# Patient Record
Sex: Female | Born: 1950 | Race: White | Hispanic: No | Smoking: Current some day smoker
Health system: Southern US, Community
[De-identification: ages and names within clinical notes are randomized; demographics above are authoritative.]

## PROBLEM LIST (undated history)

## (undated) DIAGNOSIS — G709 Myoneural disorder, unspecified: Secondary | ICD-10-CM

## (undated) DIAGNOSIS — R928 Other abnormal and inconclusive findings on diagnostic imaging of breast: Secondary | ICD-10-CM

## (undated) DIAGNOSIS — F329 Major depressive disorder, single episode, unspecified: Secondary | ICD-10-CM

## (undated) DIAGNOSIS — M8580 Other specified disorders of bone density and structure, unspecified site: Secondary | ICD-10-CM

## (undated) DIAGNOSIS — G35 Multiple sclerosis: Secondary | ICD-10-CM

## (undated) DIAGNOSIS — E785 Hyperlipidemia, unspecified: Secondary | ICD-10-CM

## (undated) DIAGNOSIS — F32A Depression, unspecified: Secondary | ICD-10-CM

## (undated) DIAGNOSIS — M65841 Other synovitis and tenosynovitis, right hand: Secondary | ICD-10-CM

## (undated) HISTORY — PX: TUBAL LIGATION: SHX77

## (undated) HISTORY — DX: Depression, unspecified: F32.A

## (undated) HISTORY — DX: Other specified disorders of bone density and structure, unspecified site: M85.80

## (undated) HISTORY — DX: Multiple sclerosis: G35

## (undated) HISTORY — DX: Hyperlipidemia, unspecified: E78.5

## (undated) HISTORY — DX: Major depressive disorder, single episode, unspecified: F32.9

---

## 1999-08-05 ENCOUNTER — Encounter: Admission: RE | Admit: 1999-08-05 | Discharge: 1999-09-20 | Payer: Self-pay | Admitting: Sports Medicine

## 1999-08-15 ENCOUNTER — Emergency Department (HOSPITAL_COMMUNITY): Admission: EM | Admit: 1999-08-15 | Discharge: 1999-08-15 | Payer: Self-pay | Admitting: Emergency Medicine

## 2000-06-03 ENCOUNTER — Other Ambulatory Visit: Admission: RE | Admit: 2000-06-03 | Discharge: 2000-06-03 | Payer: Self-pay | Admitting: Family Medicine

## 2003-12-22 ENCOUNTER — Other Ambulatory Visit: Admission: RE | Admit: 2003-12-22 | Discharge: 2003-12-22 | Payer: Self-pay | Admitting: Family Medicine

## 2004-01-04 ENCOUNTER — Encounter: Admission: RE | Admit: 2004-01-04 | Discharge: 2004-01-04 | Payer: Self-pay | Admitting: Family Medicine

## 2005-09-26 ENCOUNTER — Ambulatory Visit: Payer: Self-pay | Admitting: Family Medicine

## 2005-10-01 ENCOUNTER — Other Ambulatory Visit: Admission: RE | Admit: 2005-10-01 | Discharge: 2005-10-01 | Payer: Self-pay | Admitting: Family Medicine

## 2005-10-01 ENCOUNTER — Ambulatory Visit: Payer: Self-pay | Admitting: Family Medicine

## 2005-10-08 ENCOUNTER — Encounter: Admission: RE | Admit: 2005-10-08 | Discharge: 2005-10-08 | Payer: Self-pay | Admitting: Family Medicine

## 2005-10-21 ENCOUNTER — Encounter: Admission: RE | Admit: 2005-10-21 | Discharge: 2005-10-21 | Payer: Self-pay | Admitting: Family Medicine

## 2009-07-11 ENCOUNTER — Other Ambulatory Visit: Admission: RE | Admit: 2009-07-11 | Discharge: 2009-07-11 | Payer: Self-pay | Admitting: Family Medicine

## 2009-08-09 ENCOUNTER — Encounter: Admission: RE | Admit: 2009-08-09 | Discharge: 2009-08-09 | Payer: Self-pay | Admitting: Family Medicine

## 2010-02-13 ENCOUNTER — Other Ambulatory Visit: Admission: RE | Admit: 2010-02-13 | Discharge: 2010-02-13 | Payer: Self-pay | Admitting: Family Medicine

## 2010-05-12 ENCOUNTER — Encounter: Payer: Self-pay | Admitting: Family Medicine

## 2010-12-27 ENCOUNTER — Other Ambulatory Visit: Payer: Self-pay | Admitting: Physician Assistant

## 2010-12-27 ENCOUNTER — Other Ambulatory Visit (HOSPITAL_COMMUNITY)
Admission: RE | Admit: 2010-12-27 | Discharge: 2010-12-27 | Disposition: A | Discharge status: Home / Self Care | Payer: 59 | Source: Ambulatory Visit | Attending: Family Medicine | Admitting: Family Medicine

## 2010-12-27 DIAGNOSIS — Z01419 Encounter for gynecological examination (general) (routine) without abnormal findings: Secondary | ICD-10-CM | POA: Insufficient documentation

## 2012-01-07 ENCOUNTER — Other Ambulatory Visit (HOSPITAL_COMMUNITY)
Admission: RE | Admit: 2012-01-07 | Discharge: 2012-01-07 | Disposition: A | Discharge status: Home / Self Care | Payer: 59 | Source: Ambulatory Visit | Attending: Family Medicine | Admitting: Family Medicine

## 2012-01-07 ENCOUNTER — Other Ambulatory Visit: Payer: Self-pay | Admitting: Physician Assistant

## 2012-01-07 DIAGNOSIS — Z124 Encounter for screening for malignant neoplasm of cervix: Secondary | ICD-10-CM | POA: Insufficient documentation

## 2012-01-13 ENCOUNTER — Other Ambulatory Visit: Payer: Self-pay | Admitting: Family Medicine

## 2012-01-13 DIAGNOSIS — Z1231 Encounter for screening mammogram for malignant neoplasm of breast: Secondary | ICD-10-CM

## 2012-02-02 ENCOUNTER — Ambulatory Visit
Admission: RE | Admit: 2012-02-02 | Discharge: 2012-02-02 | Disposition: A | Discharge status: Home / Self Care | Payer: 59 | Source: Ambulatory Visit | Attending: Family Medicine | Admitting: Family Medicine

## 2012-02-02 DIAGNOSIS — Z1231 Encounter for screening mammogram for malignant neoplasm of breast: Secondary | ICD-10-CM

## 2012-02-04 ENCOUNTER — Other Ambulatory Visit: Payer: Self-pay | Admitting: Family Medicine

## 2012-02-04 DIAGNOSIS — R928 Other abnormal and inconclusive findings on diagnostic imaging of breast: Secondary | ICD-10-CM

## 2012-02-10 ENCOUNTER — Ambulatory Visit
Admission: RE | Admit: 2012-02-10 | Discharge: 2012-02-10 | Disposition: A | Discharge status: Home / Self Care | Payer: 59 | Source: Ambulatory Visit | Attending: Family Medicine | Admitting: Family Medicine

## 2012-02-10 DIAGNOSIS — R928 Other abnormal and inconclusive findings on diagnostic imaging of breast: Secondary | ICD-10-CM

## 2013-01-20 ENCOUNTER — Other Ambulatory Visit: Payer: Self-pay | Admitting: Physician Assistant

## 2013-01-20 DIAGNOSIS — N951 Menopausal and female climacteric states: Secondary | ICD-10-CM

## 2013-01-20 DIAGNOSIS — Z1231 Encounter for screening mammogram for malignant neoplasm of breast: Secondary | ICD-10-CM

## 2013-02-04 ENCOUNTER — Ambulatory Visit
Admission: RE | Admit: 2013-02-04 | Discharge: 2013-02-04 | Disposition: A | Discharge status: Home / Self Care | Payer: 59 | Source: Ambulatory Visit | Attending: Physician Assistant | Admitting: Physician Assistant

## 2013-02-04 DIAGNOSIS — N951 Menopausal and female climacteric states: Secondary | ICD-10-CM

## 2013-02-04 DIAGNOSIS — Z1231 Encounter for screening mammogram for malignant neoplasm of breast: Secondary | ICD-10-CM

## 2014-01-30 ENCOUNTER — Encounter: Payer: Self-pay | Admitting: *Deleted

## 2014-02-06 ENCOUNTER — Other Ambulatory Visit: Payer: Self-pay | Admitting: Physician Assistant

## 2014-02-06 ENCOUNTER — Other Ambulatory Visit (HOSPITAL_COMMUNITY)
Admission: RE | Admit: 2014-02-06 | Discharge: 2014-02-06 | Disposition: A | Discharge status: Home / Self Care | Payer: 59 | Source: Ambulatory Visit | Attending: Family Medicine | Admitting: Family Medicine

## 2014-02-06 DIAGNOSIS — Z124 Encounter for screening for malignant neoplasm of cervix: Secondary | ICD-10-CM | POA: Insufficient documentation

## 2014-02-08 LAB — CYTOLOGY - PAP

## 2014-07-27 ENCOUNTER — Other Ambulatory Visit: Payer: Self-pay

## 2014-07-27 DIAGNOSIS — Z1231 Encounter for screening mammogram for malignant neoplasm of breast: Secondary | ICD-10-CM

## 2014-08-03 ENCOUNTER — Ambulatory Visit
Admission: RE | Admit: 2014-08-03 | Discharge: 2014-08-03 | Disposition: A | Discharge status: Home / Self Care | Payer: 59 | Source: Ambulatory Visit

## 2014-08-03 DIAGNOSIS — Z1231 Encounter for screening mammogram for malignant neoplasm of breast: Secondary | ICD-10-CM

## 2014-10-19 ENCOUNTER — Other Ambulatory Visit: Payer: Self-pay | Admitting: Orthopedic Surgery

## 2014-11-20 ENCOUNTER — Encounter (HOSPITAL_BASED_OUTPATIENT_CLINIC_OR_DEPARTMENT_OTHER): Payer: Self-pay | Admitting: *Deleted

## 2014-11-23 ENCOUNTER — Ambulatory Visit (HOSPITAL_BASED_OUTPATIENT_CLINIC_OR_DEPARTMENT_OTHER): Payer: 59 | Admitting: Anesthesiology

## 2014-11-23 ENCOUNTER — Encounter (HOSPITAL_BASED_OUTPATIENT_CLINIC_OR_DEPARTMENT_OTHER)
Admission: RE | Disposition: A | Discharge status: Home / Self Care | Payer: Self-pay | Source: Ambulatory Visit | Attending: Orthopedic Surgery

## 2014-11-23 ENCOUNTER — Ambulatory Visit (HOSPITAL_BASED_OUTPATIENT_CLINIC_OR_DEPARTMENT_OTHER)
Admission: RE | Admit: 2014-11-23 | Discharge: 2014-11-23 | Disposition: A | Discharge status: Home / Self Care | Payer: 59 | Source: Ambulatory Visit | Attending: Orthopedic Surgery | Admitting: Orthopedic Surgery

## 2014-11-23 ENCOUNTER — Encounter (HOSPITAL_BASED_OUTPATIENT_CLINIC_OR_DEPARTMENT_OTHER): Payer: Self-pay | Admitting: Anesthesiology

## 2014-11-23 DIAGNOSIS — E785 Hyperlipidemia, unspecified: Secondary | ICD-10-CM | POA: Insufficient documentation

## 2014-11-23 DIAGNOSIS — G35 Multiple sclerosis: Secondary | ICD-10-CM | POA: Insufficient documentation

## 2014-11-23 DIAGNOSIS — F17210 Nicotine dependence, cigarettes, uncomplicated: Secondary | ICD-10-CM | POA: Diagnosis not present

## 2014-11-23 DIAGNOSIS — M6588 Other synovitis and tenosynovitis, other site: Secondary | ICD-10-CM | POA: Insufficient documentation

## 2014-11-23 HISTORY — DX: Other synovitis and tenosynovitis, right hand: M65.841

## 2014-11-23 HISTORY — PX: TRIGGER FINGER RELEASE: SHX641

## 2014-11-23 LAB — POCT HEMOGLOBIN-HEMACUE: Hemoglobin: 14 g/dL (ref 12.0–15.0)

## 2014-11-23 SURGERY — RELEASE, A1 PULLEY, FOR TRIGGER FINGER
Anesthesia: General | Site: Finger | Laterality: Right

## 2014-11-23 MED ORDER — LACTATED RINGERS IV SOLN
INTRAVENOUS | Status: DC
  Administered 2014-11-23: 11:00:00 via INTRAVENOUS

## 2014-11-23 MED ORDER — MIDAZOLAM HCL 2 MG/2ML IJ SOLN
INTRAMUSCULAR | Status: AC
  Filled 2014-11-23: qty 2

## 2014-11-23 MED ORDER — PHENYLEPHRINE HCL 10 MG/ML IJ SOLN
INTRAMUSCULAR | Status: DC | PRN
  Administered 2014-11-23: 40 ug via INTRAVENOUS

## 2014-11-23 MED ORDER — FENTANYL CITRATE (PF) 100 MCG/2ML IJ SOLN
INTRAMUSCULAR | Status: AC
  Filled 2014-11-23: qty 6

## 2014-11-23 MED ORDER — GLYCOPYRROLATE 0.2 MG/ML IJ SOLN
INTRAMUSCULAR | Status: DC | PRN
  Administered 2014-11-23: 0.2 mg via INTRAVENOUS

## 2014-11-23 MED ORDER — FENTANYL CITRATE (PF) 100 MCG/2ML IJ SOLN: 50.0000 ug | INTRAMUSCULAR | Status: DC | PRN

## 2014-11-23 MED ORDER — MEPERIDINE HCL 25 MG/ML IJ SOLN: 6.2500 mg | INTRAMUSCULAR | Status: DC | PRN

## 2014-11-23 MED ORDER — BUPIVACAINE HCL (PF) 0.25 % IJ SOLN
INTRAMUSCULAR | Status: DC | PRN
  Administered 2014-11-23: 2 mL

## 2014-11-23 MED ORDER — MIDAZOLAM HCL 2 MG/2ML IJ SOLN: 1.0000 mg | INTRAMUSCULAR | Status: DC | PRN

## 2014-11-23 MED ORDER — SCOPOLAMINE 1 MG/3DAYS TD PT72: 1.0000 | MEDICATED_PATCH | Freq: Once | TRANSDERMAL | Status: DC | PRN

## 2014-11-23 MED ORDER — HYDROCODONE-ACETAMINOPHEN 5-325 MG PO TABS: 1.0000 | ORAL_TABLET | Freq: Four times a day (QID) | ORAL | Status: DC | PRN

## 2014-11-23 MED ORDER — HYDROMORPHONE HCL 1 MG/ML IJ SOLN: 0.2500 mg | INTRAMUSCULAR | Status: DC | PRN

## 2014-11-23 MED ORDER — CEFAZOLIN SODIUM-DEXTROSE 2-3 GM-% IV SOLR
INTRAVENOUS | Status: AC
  Filled 2014-11-23: qty 50

## 2014-11-23 MED ORDER — CEFAZOLIN SODIUM-DEXTROSE 2-3 GM-% IV SOLR
2.0000 g | INTRAVENOUS | Status: AC
  Administered 2014-11-23: 2 g via INTRAVENOUS

## 2014-11-23 MED ORDER — CEFAZOLIN SODIUM-DEXTROSE 2-3 GM-% IV SOLR: 2.0000 g | INTRAVENOUS | Status: DC

## 2014-11-23 MED ORDER — EPHEDRINE SULFATE 50 MG/ML IJ SOLN
INTRAMUSCULAR | Status: DC | PRN
  Administered 2014-11-23 (×2): 10 mg via INTRAVENOUS

## 2014-11-23 MED ORDER — CHLORHEXIDINE GLUCONATE 4 % EX LIQD: 60.0000 mL | Freq: Once | CUTANEOUS | Status: DC

## 2014-11-23 MED ORDER — FENTANYL CITRATE (PF) 100 MCG/2ML IJ SOLN
INTRAMUSCULAR | Status: DC | PRN
  Administered 2014-11-23: 100 ug via INTRAVENOUS

## 2014-11-23 MED ORDER — MIDAZOLAM HCL 5 MG/5ML IJ SOLN
INTRAMUSCULAR | Status: DC | PRN
  Administered 2014-11-23: 2 mg via INTRAVENOUS

## 2014-11-23 MED ORDER — OXYCODONE HCL 5 MG/5ML PO SOLN: 5.0000 mg | Freq: Once | ORAL | Status: DC | PRN

## 2014-11-23 MED ORDER — ONDANSETRON HCL 4 MG/2ML IJ SOLN
INTRAMUSCULAR | Status: DC | PRN
  Administered 2014-11-23: 4 mg via INTRAVENOUS

## 2014-11-23 MED ORDER — OXYCODONE HCL 5 MG PO TABS: 5.0000 mg | ORAL_TABLET | Freq: Once | ORAL | Status: DC | PRN

## 2014-11-23 MED ORDER — GLYCOPYRROLATE 0.2 MG/ML IJ SOLN: 0.2000 mg | Freq: Once | INTRAMUSCULAR | Status: DC | PRN

## 2014-11-23 SURGICAL SUPPLY — 29 items
BANDAGE COBAN STERILE 2 (GAUZE/BANDAGES/DRESSINGS) ×2 IMPLANT
BLADE SURG 15 STRL LF DISP TIS (BLADE) ×1 IMPLANT
BLADE SURG 15 STRL SS (BLADE) ×1
BNDG ESMARK 4X9 LF (GAUZE/BANDAGES/DRESSINGS) ×2 IMPLANT
CHLORAPREP W/TINT 26ML (MISCELLANEOUS) ×2 IMPLANT
CORDS BIPOLAR (ELECTRODE) ×2 IMPLANT
COVER BACK TABLE 60X90IN (DRAPES) ×2 IMPLANT
COVER MAYO STAND STRL (DRAPES) ×2 IMPLANT
CUFF TOURNIQUET SINGLE 18IN (TOURNIQUET CUFF) ×2 IMPLANT
DECANTER SPIKE VIAL GLASS SM (MISCELLANEOUS) IMPLANT
DRAPE EXTREMITY T 121X128X90 (DRAPE) ×2 IMPLANT
DRAPE SURG 17X23 STRL (DRAPES) ×2 IMPLANT
GAUZE SPONGE 4X4 12PLY STRL (GAUZE/BANDAGES/DRESSINGS) ×2 IMPLANT
GAUZE XEROFORM 1X8 LF (GAUZE/BANDAGES/DRESSINGS) ×2 IMPLANT
GLOVE BIOGEL PI IND STRL 8.5 (GLOVE) ×1 IMPLANT
GLOVE BIOGEL PI INDICATOR 8.5 (GLOVE) ×1
GLOVE SURG ORTHO 8.0 STRL STRW (GLOVE) ×2 IMPLANT
GOWN STRL REUS W/ TWL LRG LVL3 (GOWN DISPOSABLE) ×1 IMPLANT
GOWN STRL REUS W/TWL LRG LVL3 (GOWN DISPOSABLE) ×1
GOWN STRL REUS W/TWL XL LVL3 (GOWN DISPOSABLE) ×4 IMPLANT
NEEDLE PRECISIONGLIDE 27X1.5 (NEEDLE) ×2 IMPLANT
NS IRRIG 1000ML POUR BTL (IV SOLUTION) ×2 IMPLANT
PACK BASIN DAY SURGERY FS (CUSTOM PROCEDURE TRAY) ×2 IMPLANT
STOCKINETTE 4X48 STRL (DRAPES) ×2 IMPLANT
SUT ETHILON 4 0 PS 2 18 (SUTURE) ×2 IMPLANT
SYR BULB 3OZ (MISCELLANEOUS) ×2 IMPLANT
SYR CONTROL 10ML LL (SYRINGE) ×2 IMPLANT
TOWEL OR 17X24 6PK STRL BLUE (TOWEL DISPOSABLE) ×2 IMPLANT
UNDERPAD 30X30 (UNDERPADS AND DIAPERS) ×2 IMPLANT

## 2014-11-23 NOTE — Brief Op Note (Signed)
11/23/2014  11:29 AM  PATIENT:  Megan Landry  64 y.o. female  PRE-OPERATIVE DIAGNOSIS:  STENOSING TENOSYNOVITIS RIGHT MIDDLE FINGER   POST-OPERATIVE DIAGNOSIS:  STENOSING TENOSYNOVITIS RIGHT MIDDLE FINGER   PROCEDURE:  Procedure(s): RELEASE A-1 PULLEY RIGHT MIDDLE FINGER  (Right)  SURGEON:  Surgeon(s) and Role:    * Cindee Salt, MD - Primary  PHYSICIAN ASSISTANT:   ASSISTANTS: none   ANESTHESIA:   local and general  EBL:     BLOOD ADMINISTERED:none  DRAINS: none   LOCAL MEDICATIONS USED:  BUPIVICAINE   SPECIMEN:  No Specimen  DISPOSITION OF SPECIMEN:  N/A  COUNTS:  YES  TOURNIQUET:   Total Tourniquet Time Documented: Forearm (Right) - -[id number] minutes Total: Forearm (Right) - -[id number] minutes   DICTATION: .Other Dictation: Dictation Number [id number]  PLAN OF CARE: Discharge to home after PACU  PATIENT DISPOSITION:  PACU - hemodynamically stable.

## 2014-11-23 NOTE — H&P (Signed)
Megan Landry is a 64 year-old right-hand dominant female referred by Dr. Sherlyn Lick for consultation with respect to catching of her right middle finger. This has been going on for approximately six months.  She recalls no history of injury.  She does have history of MS and is on gabapentin for this.  She has no history of diabetes, thyroid problems, arthritis or gout.  She complains of a constant, moderate, aching type pain with a feeling of swelling.  She states it is getting worse.  Activity makes it worse.  There is a family history of arthritis.  She has no history of diabetes, thyroid problems, arthritis or gout.   ALLERGIES:   None.  MEDICATIONS:    Gabapentin, hydroxyzine HCL, Effexor, Lipitor, Ritalin, laquinimod kapsulki, Boniva and Ampyra.  SURGICAL HISTORY:    Negative.  FAMILY MEDICAL HISTORY:    Positive for diabetes, heart disease, arthritis.  SOCIAL HISTORY:     She does not smoke, drinks socially, she is a Probation officer and divorced.  REVIEW OF SYSTEMS:    Positive for glasses, blood in her urine, balance problems, otherwise negative 14 points. Megan Landry is an 64 y.o. female.   Chief Complaint: trigger right middle finger HPI: see above  Past Medical History  Diagnosis Date  . Hyperlipidemia   . Depression   . Osteopenia   . MS (multiple sclerosis)     Currently on a study drug from [facility] for MS  . 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 Tubal ligation      Family History  Problem Relation Age of Onset  . Family history unknown: Yes   Social History:  reports that she has been smoking Cigarettes.  She does not have any smokeless tobacco history on file. She reports that she drinks alcohol. She reports that she does not use illicit drugs.  Allergies:  Allergies  Allergen Reactions  . Sulfa Antibiotics     No prescriptions prior to admission    No results found for this or  any previous visit (from the past 48 hour(s)).  No results found.   Pertinent items are noted in HPI.  Height  (1.676 m), weight 58.06 kg (128 lb).  General appearance: alert, cooperative and appears stated age Head: Normocephalic, without obvious abnormality Neck: no JVD Resp: clear to auscultation bilaterally Cardio: regular rate and rhythm, S1, S2 normal, no murmur, click, rub or gallop GI: soft, non-tender; bowel sounds normal; no masses,  no organomegaly Extremities: extremities normal, atraumatic, no cyanosis or edema, Homans sign is negative, no sign of DVT, no edema, redness or tenderness in the calves or thighs and catching right middle finger Pulses: 2+ and symmetric Skin: Skin color, texture, turgor normal. No rashes or lesions Neurologic: Grossly normal Incision/Wound: na  Assessment/Plan DIAGNOSIS:     Stenosing tenosynovitis right middle finger.  RECOMMENDATIONS/PLAN:     We have discussed the etiology of this with her along with the various treatment alternatives. She is advised we will attempt two injections, should this not resolve this for her then surgical release will be necessary.  This has recurred.  It has been injected on two occasions.  She has developed a slight flexion deformity to the PIP joint of approximately 20 degrees. We have discussed possibility of release.  The pre, peri and postoperative course were discussed along with the risks and complications.  The patient is  aware there is no guarantee with the surgery, possibility of infection, recurrence, injury to arteries, nerves, tendons, probability that she will have some deformity remain to the PIP joint, but would like to proceed to have this done. She is scheduled for release A-1 pulley right middle finger as an outpatient under regional anesthesia.  Megan Landry 11/23/2014, 7:41 AM

## 2014-11-23 NOTE — Discharge Instructions (Addendum)

## 2014-11-23 NOTE — Transfer of Care (Signed)
Immediate Anesthesia Transfer of Care Note  Patient: Megan Landry  Procedure(s) Performed: Procedure(s): RELEASE A-1 PULLEY RIGHT MIDDLE FINGER  (Right)  Patient Location: PACU  Anesthesia Type:General  Level of Consciousness: awake and patient cooperative  Airway & Oxygen Therapy: Patient Spontanous Breathing and Patient connected to face mask oxygen  Post-op Assessment: Report given to RN and Post -op Vital signs reviewed and stable  Post vital signs: Reviewed and stable  Last Vitals:  Filed Vitals:   11/23/14 0952  BP: 136/78  Pulse: 80  Temp: 36.7 C  Resp: 20    Complications: No apparent anesthesia complications

## 2014-11-23 NOTE — Anesthesia Preprocedure Evaluation (Signed)
Anesthesia Evaluation  Patient identified by MRN, date of birth, ID band Patient awake    Reviewed: Allergy & Precautions, NPO status , Patient's Chart, lab work & pertinent test results  Airway Mallampati: I  TM Distance: >3 FB Neck ROM: Full    Dental  (+) Teeth Intact, Dental Advisory Given   Pulmonary Current Smoker,  breath sounds clear to auscultation        Cardiovascular Rhythm:Regular Rate:Normal     Neuro/Psych    GI/Hepatic   Endo/Other    Renal/GU      Musculoskeletal   Abdominal   Peds  Hematology   Anesthesia Other Findings   Reproductive/Obstetrics                             Anesthesia Physical Anesthesia Plan  ASA: I  Anesthesia Plan: General   Post-op Pain Management:    Induction: Intravenous  Airway Management Planned: LMA  Additional Equipment:   Intra-op Plan:   Post-operative Plan: Extubation in OR  Informed Consent: I have reviewed the patients History and Physical, chart, labs and discussed the procedure including the risks, benefits and alternatives for the proposed anesthesia with the patient or authorized representative who has indicated his/her understanding and acceptance.   Dental advisory given  Plan Discussed with: CRNA, Anesthesiologist and Surgeon  Anesthesia Plan Comments:         Anesthesia Quick Evaluation  

## 2014-11-23 NOTE — Op Note (Signed)
Dictation Number [id number]

## 2014-11-23 NOTE — Anesthesia Postprocedure Evaluation (Signed)
  Anesthesia Post-op Note  Patient: Megan Landry  Procedure(s) Performed: Procedure(s): RELEASE A-1 PULLEY RIGHT MIDDLE FINGER  (Right)  Patient Location: PACU  Anesthesia Type: General   Level of Consciousness: awake, alert  and oriented  Airway and Oxygen Therapy: Patient Spontanous Breathing  Post-op Pain: none  Post-op Assessment: Post-op Vital signs reviewed  Post-op Vital Signs: Reviewed  Last Vitals:  Filed Vitals:   11/23/14 1300  BP: 140/70  Pulse: 84  Temp: 36.3 C  Resp: 16    Complications: No apparent anesthesia complications

## 2014-11-23 NOTE — Anesthesia Procedure Notes (Signed)
Procedure Name: LMA Insertion Date/Time: 11/23/2014 11:11 AM Performed by: Genevieve Norlander L Pre-anesthesia Checklist: Patient identified, Emergency Drugs available, Suction available, Patient being monitored and Timeout performed Patient Re-evaluated:Patient Re-evaluated prior to inductionOxygen Delivery Method: Circle System Utilized Preoxygenation: Pre-oxygenation with 100% oxygen Intubation Type: IV induction Ventilation: Mask ventilation without difficulty LMA: LMA inserted LMA Size: 4.0 Number of attempts: 1 Airway Equipment and Method: Bite block Placement Confirmation: positive ETCO2 Tube secured with: Tape Dental Injury: Teeth and Oropharynx as per pre-operative assessment

## 2014-11-23 NOTE — Op Note (Signed)
NAMEDINESHA, SCHEELE NO.:  [id number]  MEDICAL RECORD NO.:  [id number]  LOCATION:                                 FACILITY:  PHYSICIAN:  Cindee Salt, M.D.            DATE OF BIRTH:  DATE OF PROCEDURE:  11/23/2014 DATE OF DISCHARGE:                              OPERATIVE REPORT   PREOPERATIVE DIAGNOSIS:  Stenosing tenosynovitis, right middle finger.  POSTOPERATIVE DIAGNOSIS:  Stenosing tenosynovitis, right middle finger.  OPERATION:  Release of A1 pulley, right middle finger.  SURGEON:  Cindee Salt, M.D.  ANESTHESIA:  General with local infiltration.  ANESTHESIOLOGIST:  Sheldon Silvan, M.D.  HISTORY:  The patient is a 64 year old female with a history of triggering of her right middle finger.  This has not responded to conservative treatment including multiple injections.  Pre, peri, postoperative course have been discussed along with risks and complications.  When the patient requested surgical release, she is aware that there is no guarantee with the surgery; possibility of infection; recurrence of injury to arteries, nerves, tendons; incomplete relief of symptoms; dystrophy.  In the preoperative area, the patient was seen, the extremity marked by both patient and surgeon.  Antibiotic given.  DESCRIPTION OF PROCEDURE:  The patient was brought to the operating room where a general anesthetic was carried out without difficulty.  She was prepped using ChloraPrep in supine position with the right arm free.  A 3-minute dry time was allowed.  Time-out taken, confirming the patient and procedure.  A longitudinal incision was made in the right palm obliquely in nature over the A1 pulley, carried down through subcutaneous tissue.  Bleeders were electrocauterized and retractors placed.  A local infiltration with 0.25% bupivacaine was given before the incision was made, and the tourniquet placed high on the arm was inflated to 250 mmHg after exsanguination of the  limb with an Esmarch bandage.  The A1 pulley was identified.  This was incised on its radial aspect.  A small incision made centrally in A2.  A partial tenosynovectomy performed proximally.  The finger placed through a full range motion, no further triggering was noted.  The wound was copiously irrigated with saline and the skin closed with interrupted 4-0 nylon sutures.  A sterile compressive dressing with the fingers free was applied.  On deflation of the tourniquet, all fingers immediately pinked.  She was taken to the recovery room for observation in satisfactory condition.  She will be discharged home to return to the [facility] in 1 week on Vicodin.          ______________________________ Cindee Salt, M.D.     GK/MEDQ  D:  11/23/2014  T:  11/23/2014  Job:  [id number]

## 2014-11-24 ENCOUNTER — Encounter (HOSPITAL_BASED_OUTPATIENT_CLINIC_OR_DEPARTMENT_OTHER): Payer: Self-pay | Admitting: Orthopedic Surgery

## 2015-07-31 ENCOUNTER — Other Ambulatory Visit: Payer: Self-pay

## 2015-07-31 DIAGNOSIS — Z1231 Encounter for screening mammogram for malignant neoplasm of breast: Secondary | ICD-10-CM

## 2015-08-17 ENCOUNTER — Ambulatory Visit
Admission: RE | Admit: 2015-08-17 | Discharge: 2015-08-17 | Disposition: A | Discharge status: Home / Self Care | Payer: BLUE CROSS/BLUE SHIELD | Source: Ambulatory Visit

## 2015-08-17 DIAGNOSIS — Z1231 Encounter for screening mammogram for malignant neoplasm of breast: Secondary | ICD-10-CM | POA: Diagnosis not present

## 2015-08-20 HISTORY — PX: BREAST BIOPSY: SHX20

## 2015-08-21 ENCOUNTER — Other Ambulatory Visit: Payer: Self-pay | Admitting: Physician Assistant

## 2015-08-21 DIAGNOSIS — R928 Other abnormal and inconclusive findings on diagnostic imaging of breast: Secondary | ICD-10-CM

## 2015-08-24 ENCOUNTER — Ambulatory Visit
Admission: RE | Admit: 2015-08-24 | Discharge: 2015-08-24 | Disposition: A | Discharge status: Home / Self Care | Payer: BLUE CROSS/BLUE SHIELD | Source: Ambulatory Visit | Attending: Physician Assistant | Admitting: Physician Assistant

## 2015-08-24 ENCOUNTER — Other Ambulatory Visit: Payer: Self-pay | Admitting: Physician Assistant

## 2015-08-24 DIAGNOSIS — R928 Other abnormal and inconclusive findings on diagnostic imaging of breast: Secondary | ICD-10-CM

## 2015-08-24 DIAGNOSIS — N6489 Other specified disorders of breast: Secondary | ICD-10-CM | POA: Diagnosis not present

## 2015-08-27 ENCOUNTER — Other Ambulatory Visit: Payer: Self-pay | Admitting: Physician Assistant

## 2015-08-27 ENCOUNTER — Ambulatory Visit
Admission: RE | Admit: 2015-08-27 | Discharge: 2015-08-27 | Disposition: A | Discharge status: Home / Self Care | Payer: BLUE CROSS/BLUE SHIELD | Source: Ambulatory Visit | Attending: Physician Assistant | Admitting: Physician Assistant

## 2015-08-27 ENCOUNTER — Inpatient Hospital Stay: Admission: RE | Admit: 2015-08-27 | Payer: BLUE CROSS/BLUE SHIELD | Source: Ambulatory Visit

## 2015-08-27 DIAGNOSIS — N6489 Other specified disorders of breast: Secondary | ICD-10-CM | POA: Diagnosis not present

## 2015-08-27 DIAGNOSIS — R928 Other abnormal and inconclusive findings on diagnostic imaging of breast: Secondary | ICD-10-CM

## 2015-08-27 DIAGNOSIS — Z712 Person consulting for explanation of examination or test findings: Secondary | ICD-10-CM

## 2015-08-27 DIAGNOSIS — N6011 Diffuse cystic mastopathy of right breast: Secondary | ICD-10-CM | POA: Diagnosis not present

## 2015-09-05 ENCOUNTER — Other Ambulatory Visit: Payer: Self-pay | Admitting: General Surgery

## 2015-09-05 DIAGNOSIS — F329 Major depressive disorder, single episode, unspecified: Secondary | ICD-10-CM | POA: Diagnosis not present

## 2015-09-05 DIAGNOSIS — G35 Multiple sclerosis: Secondary | ICD-10-CM | POA: Diagnosis not present

## 2015-09-05 DIAGNOSIS — N6489 Other specified disorders of breast: Secondary | ICD-10-CM | POA: Diagnosis not present

## 2015-09-20 ENCOUNTER — Other Ambulatory Visit: Payer: Self-pay | Admitting: General Surgery

## 2015-09-20 DIAGNOSIS — R928 Other abnormal and inconclusive findings on diagnostic imaging of breast: Secondary | ICD-10-CM

## 2015-09-20 HISTORY — PX: BREAST EXCISIONAL BIOPSY: SUR124

## 2015-10-12 ENCOUNTER — Encounter (HOSPITAL_BASED_OUTPATIENT_CLINIC_OR_DEPARTMENT_OTHER): Payer: Self-pay | Admitting: *Deleted

## 2015-10-12 NOTE — Progress Notes (Signed)
Coming Monday for CMET, CBC,Diff and to  Pick up Boost drink. Pt has Multiple Sclerosis - mobile.

## 2015-10-15 ENCOUNTER — Encounter (HOSPITAL_BASED_OUTPATIENT_CLINIC_OR_DEPARTMENT_OTHER)
Admission: RE | Admit: 2015-10-15 | Discharge: 2015-10-15 | Disposition: A | Discharge status: Home / Self Care | Payer: BLUE CROSS/BLUE SHIELD | Source: Ambulatory Visit | Attending: General Surgery | Admitting: General Surgery

## 2015-10-15 DIAGNOSIS — F329 Major depressive disorder, single episode, unspecified: Secondary | ICD-10-CM | POA: Diagnosis not present

## 2015-10-15 DIAGNOSIS — N62 Hypertrophy of breast: Secondary | ICD-10-CM | POA: Diagnosis not present

## 2015-10-15 DIAGNOSIS — E785 Hyperlipidemia, unspecified: Secondary | ICD-10-CM | POA: Diagnosis not present

## 2015-10-15 DIAGNOSIS — Z87891 Personal history of nicotine dependence: Secondary | ICD-10-CM | POA: Diagnosis not present

## 2015-10-15 DIAGNOSIS — G35 Multiple sclerosis: Secondary | ICD-10-CM | POA: Diagnosis not present

## 2015-10-15 DIAGNOSIS — R928 Other abnormal and inconclusive findings on diagnostic imaging of breast: Secondary | ICD-10-CM | POA: Diagnosis present

## 2015-10-15 DIAGNOSIS — M810 Age-related osteoporosis without current pathological fracture: Secondary | ICD-10-CM | POA: Diagnosis not present

## 2015-10-15 DIAGNOSIS — Z803 Family history of malignant neoplasm of breast: Secondary | ICD-10-CM | POA: Diagnosis not present

## 2015-10-15 LAB — COMPREHENSIVE METABOLIC PANEL
ALBUMIN: 4 g/dL (ref 3.5–5.0)
ALT: 23 U/L (ref 14–54)
AST: 25 U/L (ref 15–41)
Alkaline Phosphatase: 77 U/L (ref 38–126)
Anion gap: 4 — ABNORMAL LOW (ref 5–15)
BILIRUBIN TOTAL: 0.4 mg/dL (ref 0.3–1.2)
BUN: 7 mg/dL (ref 6–20)
CHLORIDE: 105 mmol/L (ref 101–111)
CO2: 30 mmol/L (ref 22–32)
CREATININE: 0.76 mg/dL (ref 0.44–1.00)
Calcium: 9.4 mg/dL (ref 8.9–10.3)
GFR calc Af Amer: >60 mL/min (ref >60)
GFR calc non Af Amer: >60 mL/min (ref >60)
GLUCOSE: 112 mg/dL — AB (ref 65–99)
POTASSIUM: 4.7 mmol/L (ref 3.5–5.1)
Sodium: 139 mmol/L (ref 135–145)
Total Protein: 6.9 g/dL (ref 6.5–8.1)

## 2015-10-15 LAB — CBC WITH DIFFERENTIAL/PLATELET
BASOS ABS: 0 10*3/uL (ref 0.0–0.1)
BASOS PCT: 0 %
Eosinophils Absolute: 0.1 10*3/uL (ref 0.0–0.7)
Eosinophils Relative: 1 %
HEMATOCRIT: 39.7 % (ref 36.0–46.0)
Hemoglobin: 12.5 g/dL (ref 12.0–15.0)
Lymphocytes Relative: 38 %
Lymphs Abs: 2.2 10*3/uL (ref 0.7–4.0)
MCH: 29.3 pg (ref 26.0–34.0)
MCHC: 31.5 g/dL (ref 30.0–36.0)
MCV: 93.2 fL (ref 78.0–100.0)
MONO ABS: 0.6 10*3/uL (ref 0.1–1.0)
Monocytes Relative: 10 %
NEUTROS ABS: 3 10*3/uL (ref 1.7–7.7)
NEUTROS PCT: 51 %
PLATELETS: 314 10*3/uL (ref 150–400)
RBC: 4.26 MIL/uL (ref 3.87–5.11)
RDW: 13 % (ref 11.5–15.5)
WBC: 5.8 10*3/uL (ref 4.0–10.5)

## 2015-10-15 NOTE — Progress Notes (Signed)
Pt instructed to drink Boost before 0400 day of surgery, pt verbalized understanding.

## 2015-10-18 ENCOUNTER — Ambulatory Visit
Admission: RE | Admit: 2015-10-18 | Discharge: 2015-10-18 | Disposition: A | Discharge status: Home / Self Care | Payer: BLUE CROSS/BLUE SHIELD | Source: Ambulatory Visit | Attending: General Surgery | Admitting: General Surgery

## 2015-10-18 DIAGNOSIS — R928 Other abnormal and inconclusive findings on diagnostic imaging of breast: Secondary | ICD-10-CM | POA: Diagnosis not present

## 2015-10-18 NOTE — H&P (Signed)
Megan Landry  Location: [facility] Patient #: [id number] DOB: 1951-04-21 Single / Language: [place] / Race: White Female    History of Present Illness   The patient is a 65 year old female who presents with a breast mass. This is a 65 year old Caucasian female, referred by Dr. Baird Lyons at the [facility] were for evaluation of a complex sclerosing lesion in the right breast. Megan Hillock, PA is her PCP.  She has no prior history of breast problems. Gets annual mammograms. Recent screening mammograms led to further diagnostic mammograms and ultrasound of the right breast. These reveal a 4.5 cm area of density and distortion in the posterior third of the right breast at the 12:30 position. There was no ultrasound correlate. This was felt to be suspicious for cancer versus complex sclerosing lesion. Image guided biopsy shows radial scar, complex sclerosing lesion, calcifications.  Family history reveals that 3 maternal aunts had breast cancer and one niece had breast cancer and bilateral mastectomies. Her mother does not have cancer. There is no family history of ovarian cancer, pancreatic cancer or colon cancer.  Comorbidities include multiple sclerosis, osteoporosis, and depression. She is single has 1 child. She works as a Nurse, mental health at [employer]. Occasional tobacco use. 1 alcoholic beverage a day.  Considering the histology and her family history, we both felt that it was reasonable and desirable to go ahead with conservative lumpectomy of this area to rule out an upgrade lesion. She will be scheduled for right breast lumpectomy with radioactive seed localization in the near future. I discussed the indications, details, techniques, and numerous risk of the surgery with her. She is aware of the risk of bleeding, infection, reoperation if cancer, cosmetic deformity, nerve damage , chronic numbness or pain, and other unforeseen problems. She understands  these issues well. All of her questions are answered. She agrees with this plan.   Other Problems  Arthritis Depression Heart murmur Other disease, cancer, significant illness  Past Surgical History  Oral Surgery  Diagnostic Studies History  Colonoscopy 1-5 years ago Mammogram within last year Pap Smear 1-5 years ago  Allergies  Sulfa 10 *OPHTHALMIC AGENTS*  Medication History  Gabapentin (300MG  Capsule, Oral four times daily) Active. HydrOXYzine HCl (25MG  Tablet, Oral) Active. Atorvastatin Calcium (20MG  Tablet, Oral) Active. Effexor XR (150MG  Capsule ER 24HR, Oral) Active. Boniva (150MG  Tablet, Oral) Active. Ampyra (10MG  Tablet ER 12HR, Oral) Active. Temazepam (15MG  Capsule, Oral) Active. Medications Reconciled  Social History  Alcohol use Occasional alcohol use. Caffeine use Coffee, Tea. No drug use Tobacco use Former smoker.  Family History  Alcohol Abuse Father, Son. Arthritis Sister. Breast Cancer Family Members In General. Depression Sister. Diabetes Mellitus Family Members In General. Heart Disease Father, Mother. Heart disease in female family member before age 67  Pregnancy / Birth History  Age at menarche 16 years. Age of menopause <47 Gravida 1 Maternal age 4-35 Para 1    Review of Systems  General Not Present- Appetite Loss, Chills, Fatigue, Fever, Night Sweats, Weight Gain and Weight Loss. Skin Present- Hives. Not Present- Change in Wart/Mole, Dryness, Jaundice, New Lesions, Non-Healing Wounds, Rash and Ulcer. HEENT Present- Wears glasses/contact lenses. Not Present- Earache, Hearing Loss, Hoarseness, Nose Bleed, Oral Ulcers, Ringing in the Ears, Seasonal Allergies, Sinus Pain, Sore Throat, Visual Disturbances and Yellow Eyes. Respiratory Not Present- Bloody sputum, Chronic Cough, Difficulty Breathing, Snoring and Wheezing. Breast Not Present- Breast Mass, Breast Pain, Nipple Discharge and Skin  Changes. Cardiovascular Present- Leg Cramps and Swelling  of Extremities. Not Present- Chest Pain, Difficulty Breathing Lying Down, Palpitations, Rapid Heart Rate and Shortness of Breath. Gastrointestinal Present- Bloating and Excessive gas. Not Present- Abdominal Pain, Bloody Stool, Change in Bowel Habits, Chronic diarrhea, Constipation, Difficulty Swallowing, Gets full quickly at meals, Hemorrhoids, Indigestion, Nausea, Rectal Pain and Vomiting. Female Genitourinary Not Present- Frequency, Nocturia, Painful Urination, Pelvic Pain and Urgency. Musculoskeletal Present- Joint Stiffness and Muscle Weakness. Not Present- Back Pain, Joint Pain, Muscle Pain and Swelling of Extremities. Neurological Present- Numbness, Tingling, Trouble walking and Weakness. Not Present- Decreased Memory, Fainting, Headaches, Seizures and Tremor. Psychiatric Present- Depression. Not Present- Anxiety, Bipolar, Change in Sleep Pattern, Fearful and Frequent crying. Endocrine Not Present- Cold Intolerance, Excessive Hunger, Hair Changes, Heat Intolerance, Hot flashes and New Diabetes. Hematology Not Present- Easy Bruising, Excessive bleeding, Gland problems, HIV and Persistent Infections.  Vitals  Weight: 133 lb Height: 66in Body Surface Area: 1.68 m Body Mass Index: 21.47 kg/m  Temp.: 97.48F  Pulse: 88 (Regular)  BP: 124/78 (Sitting, Left Arm, Standard)       Physical Exam General Mental Status-Alert. General Appearance-Consistent with stated age. Hydration-Well hydrated. Voice-Normal.  Head and Neck Head-normocephalic, atraumatic with no lesions or palpable masses. Trachea-midline. Thyroid Gland Characteristics - normal size and consistency.  Eye Eyeball - Bilateral-Extraocular movements intact. Sclera/Conjunctiva - Bilateral-No scleral icterus.  Chest and Lung Exam Chest and lung exam reveals -quiet, even and easy respiratory effort with no use of accessory muscles and on  auscultation, normal breath sounds, no adventitious sounds and normal vocal resonance. Inspection Chest Wall - Normal. Back - normal.  Breast Note: Medium size breasts. A little ptotic. 34 DD size according to her. Biopsy puncture wound right breast upper outer quadrant. No hematoma or mass. No other skin changes in either breast. No palpable mass in either breast. No axillary adenopathy on either side.   Cardiovascular Cardiovascular examination reveals -normal heart sounds, regular rate and rhythm with no murmurs and normal pedal pulses bilaterally.  Abdomen Inspection Inspection of the abdomen reveals - No Hernias. Skin - Scar - no surgical scars. Palpation/Percussion Palpation and Percussion of the abdomen reveal - Soft, Non Tender, No Rebound tenderness, No Rigidity (guarding) and No hepatosplenomegaly. Auscultation Auscultation of the abdomen reveals - Bowel sounds normal.  Neurologic Neurologic evaluation reveals -alert and oriented x 3 with no impairment of recent or remote memory. Mental Status-Normal.  Musculoskeletal Normal Exam - Left-Upper Extremity Strength Normal and Lower Extremity Strength Normal. Normal Exam - Right-Upper Extremity Strength Normal and Lower Extremity Strength Normal.  Lymphatic Head & Neck  General Head & Neck Lymphatics: Bilateral - Description - Normal. Axillary  General Axillary Region: Bilateral - Description - Normal. Tenderness - Non Tender. Femoral & Inguinal  Generalized Femoral & Inguinal Lymphatics: Bilateral - Description - Normal. Tenderness - Non Tender.    Assessment & Plan  RADIAL SCAR OF BREAST (N64.89)   Your recent imaging studies and biopsy showed abnormal density in the right breast, deep posterior position. The biopsy shows a radial scar, complex sclerosing lesion, and calcifications. There is probably a 10% or less chance that excision of the surgery would reveal in situ cancer or precancerous  changes. Because of your family history of breast cancer, I feel that it is reasonable to offer excisional biopsy of this area to be sure, and you have stated that you agree You'll be scheduled for right breast lumpectomy with radioactive seed localization. I discussed the indications, techniques, and numerous risk of the surgery with you Please  review the information pamphlet we gave you.  MULTIPLE SCLEROSIS (G35) MAJOR DEPRESSION, CHRONIC (F32.9)    Megan Landry, M.D., [facility] General and Minimally invasive Surgery Breast and Colorectal Surgery Office:   [phone] Pager:   [phone]

## 2015-10-19 ENCOUNTER — Encounter (HOSPITAL_BASED_OUTPATIENT_CLINIC_OR_DEPARTMENT_OTHER)
Admission: RE | Disposition: A | Discharge status: Home / Self Care | Payer: Self-pay | Source: Ambulatory Visit | Attending: General Surgery

## 2015-10-19 ENCOUNTER — Ambulatory Visit (HOSPITAL_BASED_OUTPATIENT_CLINIC_OR_DEPARTMENT_OTHER): Payer: BLUE CROSS/BLUE SHIELD | Admitting: Anesthesiology

## 2015-10-19 ENCOUNTER — Ambulatory Visit (HOSPITAL_BASED_OUTPATIENT_CLINIC_OR_DEPARTMENT_OTHER)
Admission: RE | Admit: 2015-10-19 | Discharge: 2015-10-19 | Disposition: A | Discharge status: Home / Self Care | Payer: BLUE CROSS/BLUE SHIELD | Source: Ambulatory Visit | Attending: General Surgery | Admitting: General Surgery

## 2015-10-19 ENCOUNTER — Ambulatory Visit
Admission: RE | Admit: 2015-10-19 | Discharge: 2015-10-19 | Disposition: A | Discharge status: Home / Self Care | Payer: BLUE CROSS/BLUE SHIELD | Source: Ambulatory Visit | Attending: General Surgery | Admitting: General Surgery

## 2015-10-19 ENCOUNTER — Encounter (HOSPITAL_BASED_OUTPATIENT_CLINIC_OR_DEPARTMENT_OTHER): Payer: Self-pay | Admitting: *Deleted

## 2015-10-19 DIAGNOSIS — E785 Hyperlipidemia, unspecified: Secondary | ICD-10-CM | POA: Diagnosis not present

## 2015-10-19 DIAGNOSIS — N6011 Diffuse cystic mastopathy of right breast: Secondary | ICD-10-CM | POA: Diagnosis not present

## 2015-10-19 DIAGNOSIS — N6081 Other benign mammary dysplasias of right breast: Secondary | ICD-10-CM | POA: Diagnosis not present

## 2015-10-19 DIAGNOSIS — Z803 Family history of malignant neoplasm of breast: Secondary | ICD-10-CM | POA: Diagnosis not present

## 2015-10-19 DIAGNOSIS — G35 Multiple sclerosis: Secondary | ICD-10-CM | POA: Diagnosis not present

## 2015-10-19 DIAGNOSIS — N63 Unspecified lump in breast: Secondary | ICD-10-CM | POA: Diagnosis not present

## 2015-10-19 DIAGNOSIS — M810 Age-related osteoporosis without current pathological fracture: Secondary | ICD-10-CM | POA: Insufficient documentation

## 2015-10-19 DIAGNOSIS — N62 Hypertrophy of breast: Secondary | ICD-10-CM | POA: Diagnosis not present

## 2015-10-19 DIAGNOSIS — F329 Major depressive disorder, single episode, unspecified: Secondary | ICD-10-CM | POA: Insufficient documentation

## 2015-10-19 DIAGNOSIS — R928 Other abnormal and inconclusive findings on diagnostic imaging of breast: Secondary | ICD-10-CM

## 2015-10-19 DIAGNOSIS — Z87891 Personal history of nicotine dependence: Secondary | ICD-10-CM | POA: Diagnosis not present

## 2015-10-19 HISTORY — DX: Other abnormal and inconclusive findings on diagnostic imaging of breast: R92.8

## 2015-10-19 HISTORY — PX: BREAST LUMPECTOMY WITH RADIOACTIVE SEED LOCALIZATION: SHX6424

## 2015-10-19 HISTORY — DX: Myoneural disorder, unspecified: G70.9

## 2015-10-19 SURGERY — BREAST LUMPECTOMY WITH RADIOACTIVE SEED LOCALIZATION
Anesthesia: General | Site: Breast | Laterality: Right

## 2015-10-19 MED ORDER — CEFAZOLIN SODIUM-DEXTROSE 2-3 GM-% IV SOLR
INTRAVENOUS | Status: DC | PRN
  Administered 2015-10-19: 2 g via INTRAVENOUS

## 2015-10-19 MED ORDER — PROPOFOL 10 MG/ML IV BOLUS
INTRAVENOUS | Status: AC
  Filled 2015-10-19: qty 40

## 2015-10-19 MED ORDER — MIDAZOLAM HCL 2 MG/2ML IJ SOLN
INTRAMUSCULAR | Status: AC
  Filled 2015-10-19: qty 2

## 2015-10-19 MED ORDER — PHENYLEPHRINE HCL 10 MG/ML IJ SOLN
INTRAMUSCULAR | Status: DC | PRN
  Administered 2015-10-19: 80 ug via INTRAVENOUS

## 2015-10-19 MED ORDER — MIDAZOLAM HCL 5 MG/5ML IJ SOLN
INTRAMUSCULAR | Status: DC | PRN
  Administered 2015-10-19: 2 mg via INTRAVENOUS

## 2015-10-19 MED ORDER — CEFAZOLIN SODIUM-DEXTROSE 2-4 GM/100ML-% IV SOLN: 2.0000 g | INTRAVENOUS | Status: DC

## 2015-10-19 MED ORDER — DEXAMETHASONE SODIUM PHOSPHATE 10 MG/ML IJ SOLN
INTRAMUSCULAR | Status: AC
  Filled 2015-10-19: qty 1

## 2015-10-19 MED ORDER — PROPOFOL 10 MG/ML IV BOLUS
INTRAVENOUS | Status: DC | PRN
  Administered 2015-10-19: 100 mg via INTRAVENOUS

## 2015-10-19 MED ORDER — METOCLOPRAMIDE HCL 5 MG/ML IJ SOLN
10.0000 mg | Freq: Once | INTRAMUSCULAR | Status: DC | PRN
Start: 2015-10-19 — End: 2015-10-19

## 2015-10-19 MED ORDER — GLYCOPYRROLATE 0.2 MG/ML IJ SOLN: 0.2000 mg | Freq: Once | INTRAMUSCULAR | Status: DC | PRN

## 2015-10-19 MED ORDER — CHLORHEXIDINE GLUCONATE 4 % EX LIQD: 1.0000 "application " | Freq: Once | CUTANEOUS | Status: DC

## 2015-10-19 MED ORDER — EPINEPHRINE HCL 1 MG/ML IJ SOLN
INTRAMUSCULAR | Status: AC
  Filled 2015-10-19: qty 1

## 2015-10-19 MED ORDER — ONDANSETRON HCL 4 MG/2ML IJ SOLN
INTRAMUSCULAR | Status: AC
  Filled 2015-10-19: qty 2

## 2015-10-19 MED ORDER — LIDOCAINE HCL (CARDIAC) 20 MG/ML IV SOLN
INTRAVENOUS | Status: DC | PRN
  Administered 2015-10-19 (×2): 50 mg via INTRAVENOUS

## 2015-10-19 MED ORDER — BUPIVACAINE HCL (PF) 0.25 % IJ SOLN
INTRAMUSCULAR | Status: AC
  Filled 2015-10-19: qty 30

## 2015-10-19 MED ORDER — SCOPOLAMINE 1 MG/3DAYS TD PT72: 1.0000 | MEDICATED_PATCH | Freq: Once | TRANSDERMAL | Status: DC | PRN

## 2015-10-19 MED ORDER — MIDAZOLAM HCL 2 MG/2ML IJ SOLN: 1.0000 mg | INTRAMUSCULAR | Status: DC | PRN

## 2015-10-19 MED ORDER — PHENYLEPHRINE 40 MCG/ML (10ML) SYRINGE FOR IV PUSH (FOR BLOOD PRESSURE SUPPORT)
PREFILLED_SYRINGE | INTRAVENOUS | Status: AC
  Filled 2015-10-19: qty 10

## 2015-10-19 MED ORDER — EPHEDRINE 5 MG/ML INJ
INTRAVENOUS | Status: AC
  Filled 2015-10-19: qty 10

## 2015-10-19 MED ORDER — ONDANSETRON HCL 4 MG/2ML IJ SOLN
INTRAMUSCULAR | Status: DC | PRN
  Administered 2015-10-19: 4 mg via INTRAVENOUS

## 2015-10-19 MED ORDER — CEFAZOLIN SODIUM-DEXTROSE 2-4 GM/100ML-% IV SOLN
INTRAVENOUS | Status: AC
  Filled 2015-10-19: qty 100

## 2015-10-19 MED ORDER — LIDOCAINE-EPINEPHRINE (PF) 1 %-1:200000 IJ SOLN
INTRAMUSCULAR | Status: AC
  Filled 2015-10-19: qty 30

## 2015-10-19 MED ORDER — LIDOCAINE 2% (20 MG/ML) 5 ML SYRINGE
INTRAMUSCULAR | Status: AC
  Filled 2015-10-19: qty 5

## 2015-10-19 MED ORDER — HYDROCODONE-ACETAMINOPHEN 7.5-325 MG PO TABS: 1.0000 | ORAL_TABLET | Freq: Once | ORAL | Status: DC | PRN

## 2015-10-19 MED ORDER — MEPERIDINE HCL 25 MG/ML IJ SOLN: 6.2500 mg | INTRAMUSCULAR | Status: DC | PRN

## 2015-10-19 MED ORDER — HYDROCODONE-ACETAMINOPHEN 5-325 MG PO TABS: 1.0000 | ORAL_TABLET | Freq: Four times a day (QID) | ORAL | Status: AC | PRN

## 2015-10-19 MED ORDER — BUPIVACAINE-EPINEPHRINE (PF) 0.5% -1:200000 IJ SOLN
INTRAMUSCULAR | Status: DC | PRN
  Administered 2015-10-19: 10 mL via PERINEURAL

## 2015-10-19 MED ORDER — FENTANYL CITRATE (PF) 100 MCG/2ML IJ SOLN
INTRAMUSCULAR | Status: AC
  Filled 2015-10-19: qty 2

## 2015-10-19 MED ORDER — LACTATED RINGERS IV SOLN
INTRAVENOUS | Status: DC | PRN
  Administered 2015-10-19 (×2): via INTRAVENOUS

## 2015-10-19 MED ORDER — DEXAMETHASONE SODIUM PHOSPHATE 4 MG/ML IJ SOLN
INTRAMUSCULAR | Status: DC | PRN
  Administered 2015-10-19: 10 mg via INTRAVENOUS

## 2015-10-19 MED ORDER — LACTATED RINGERS IV SOLN: INTRAVENOUS | Status: DC

## 2015-10-19 MED ORDER — FENTANYL CITRATE (PF) 100 MCG/2ML IJ SOLN: 50.0000 ug | INTRAMUSCULAR | Status: DC | PRN

## 2015-10-19 MED ORDER — BUPIVACAINE-EPINEPHRINE (PF) 0.5% -1:200000 IJ SOLN
INTRAMUSCULAR | Status: AC
  Filled 2015-10-19: qty 30

## 2015-10-19 MED ORDER — EPHEDRINE SULFATE 50 MG/ML IJ SOLN
INTRAMUSCULAR | Status: DC | PRN
  Administered 2015-10-19 (×2): 10 mg via INTRAVENOUS

## 2015-10-19 MED ORDER — FENTANYL CITRATE (PF) 100 MCG/2ML IJ SOLN: 25.0000 ug | INTRAMUSCULAR | Status: DC | PRN

## 2015-10-19 SURGICAL SUPPLY — 59 items
APPLIER CLIP 9.375 MED OPEN (MISCELLANEOUS) ×2
BENZOIN TINCTURE PRP APPL 2/3 (GAUZE/BANDAGES/DRESSINGS) IMPLANT
BINDER BREAST LRG (GAUZE/BANDAGES/DRESSINGS) IMPLANT
BINDER BREAST MEDIUM (GAUZE/BANDAGES/DRESSINGS) ×2 IMPLANT
BINDER BREAST XLRG (GAUZE/BANDAGES/DRESSINGS) IMPLANT
BINDER BREAST XXLRG (GAUZE/BANDAGES/DRESSINGS) IMPLANT
BLADE HEX COATED 2.75 (ELECTRODE) ×2 IMPLANT
BLADE SURG 10 STRL SS (BLADE) IMPLANT
BLADE SURG 15 STRL LF DISP TIS (BLADE) ×1 IMPLANT
BLADE SURG 15 STRL SS (BLADE) ×1
CANISTER SUC SOCK COL 7IN (MISCELLANEOUS) IMPLANT
CANISTER SUCT 1200ML W/VALVE (MISCELLANEOUS) ×2 IMPLANT
CHLORAPREP W/TINT 26ML (MISCELLANEOUS) ×2 IMPLANT
CLIP APPLIE 9.375 MED OPEN (MISCELLANEOUS) ×1 IMPLANT
COVER BACK TABLE 60X90IN (DRAPES) ×2 IMPLANT
COVER MAYO STAND STRL (DRAPES) ×2 IMPLANT
COVER PROBE W GEL 5X96 (DRAPES) ×2 IMPLANT
DECANTER SPIKE VIAL GLASS SM (MISCELLANEOUS) IMPLANT
DERMABOND ADVANCED (GAUZE/BANDAGES/DRESSINGS) ×1
DERMABOND ADVANCED .7 DNX12 (GAUZE/BANDAGES/DRESSINGS) ×1 IMPLANT
DEVICE DUBIN W/COMP PLATE 8390 (MISCELLANEOUS) ×2 IMPLANT
DRAPE LAPAROSCOPIC ABDOMINAL (DRAPES) ×2 IMPLANT
DRAPE UTILITY XL STRL (DRAPES) ×2 IMPLANT
DRSG PAD ABDOMINAL 8X10 ST (GAUZE/BANDAGES/DRESSINGS) ×2 IMPLANT
ELECT REM PT RETURN 9FT ADLT (ELECTROSURGICAL) ×2
ELECTRODE REM PT RTRN 9FT ADLT (ELECTROSURGICAL) ×1 IMPLANT
GLOVE BIOGEL PI IND STRL 7.5 (GLOVE) ×1 IMPLANT
GLOVE BIOGEL PI INDICATOR 7.5 (GLOVE) ×1
GLOVE EUDERMIC 7 POWDERFREE (GLOVE) ×2 IMPLANT
GOWN STRL REUS W/ TWL LRG LVL3 (GOWN DISPOSABLE) ×1 IMPLANT
GOWN STRL REUS W/ TWL XL LVL3 (GOWN DISPOSABLE) ×1 IMPLANT
GOWN STRL REUS W/TWL LRG LVL3 (GOWN DISPOSABLE) ×1
GOWN STRL REUS W/TWL XL LVL3 (GOWN DISPOSABLE) ×1
ILLUMINATOR WAVEGUIDE N/F (MISCELLANEOUS) IMPLANT
KIT MARKER MARGIN INK (KITS) ×2 IMPLANT
LIGHT WAVEGUIDE WIDE FLAT (MISCELLANEOUS) ×2 IMPLANT
NEEDLE HYPO 25X1 1.5 SAFETY (NEEDLE) ×2 IMPLANT
NS IRRIG 1000ML POUR BTL (IV SOLUTION) ×2 IMPLANT
PACK BASIN DAY SURGERY FS (CUSTOM PROCEDURE TRAY) ×2 IMPLANT
PENCIL BUTTON HOLSTER BLD 10FT (ELECTRODE) ×2 IMPLANT
SHEET MEDIUM DRAPE 40X70 STRL (DRAPES) IMPLANT
SLEEVE SCD COMPRESS KNEE MED (MISCELLANEOUS) ×2 IMPLANT
SPONGE GAUZE 4X4 12PLY STER LF (GAUZE/BANDAGES/DRESSINGS) ×2 IMPLANT
SPONGE LAP 18X18 X RAY DECT (DISPOSABLE) IMPLANT
SPONGE LAP 4X18 X RAY DECT (DISPOSABLE) ×2 IMPLANT
STRIP CLOSURE SKIN 1/2X4 (GAUZE/BANDAGES/DRESSINGS) IMPLANT
SUT ETHILON 3 0 FSL (SUTURE) IMPLANT
SUT MNCRL AB 4-0 PS2 18 (SUTURE) ×2 IMPLANT
SUT SILK 2 0 SH (SUTURE) ×2 IMPLANT
SUT VIC AB 2-0 CT1 27 (SUTURE) ×1
SUT VIC AB 2-0 CT1 TAPERPNT 27 (SUTURE) ×1 IMPLANT
SUT VIC AB 3-0 SH 27 (SUTURE)
SUT VIC AB 3-0 SH 27X BRD (SUTURE) IMPLANT
SUT VICRYL 3-0 CR8 SH (SUTURE) ×2 IMPLANT
SYRINGE 10CC LL (SYRINGE) ×2 IMPLANT
TOWEL OR 17X24 6PK STRL BLUE (TOWEL DISPOSABLE) ×2 IMPLANT
TOWEL OR NON WOVEN STRL DISP B (DISPOSABLE) IMPLANT
TUBE CONNECTING 20X1/4 (TUBING) ×2 IMPLANT
YANKAUER SUCT BULB TIP NO VENT (SUCTIONS) ×2 IMPLANT

## 2015-10-19 NOTE — Interval H&P Note (Signed)
History and Physical Interval Note:  10/19/2015 6:47 AM  Megan Landry  has presented today for surgery, with the diagnosis of Right breast mass   The various methods of treatment have been discussed with the patient and family. After consideration of risks, benefits and other options for treatment, the patient has consented to  Procedure(s): RIGHT BREAST LUMPECTOMY WITH RADIOACTIVE SEED LOCALIZATION (Right) as a surgical intervention .  The patient's history has been reviewed, patient examined, no change in status, stable for surgery.  I have reviewed the patient's chart and labs.  Questions were answered to the patient's satisfaction.     Ernestene Mention

## 2015-10-19 NOTE — Anesthesia Procedure Notes (Signed)
Procedure Name: LMA Insertion Date/Time: 10/19/2015 7:32 AM Performed by: Genevieve Norlander L Pre-anesthesia Checklist: Patient identified, Emergency Drugs available, Suction available, Patient being monitored and Timeout performed Patient Re-evaluated:Patient Re-evaluated prior to inductionOxygen Delivery Method: Circle system utilized Preoxygenation: Pre-oxygenation with 100% oxygen Intubation Type: IV induction Ventilation: Mask ventilation without difficulty LMA: LMA inserted LMA Size: 4.0 Number of attempts: 1 Airway Equipment and Method: Bite block Placement Confirmation: positive ETCO2 Tube secured with: Tape Dental Injury: Teeth and Oropharynx as per pre-operative assessment

## 2015-10-19 NOTE — Transfer of Care (Signed)
Immediate Anesthesia Transfer of Care Note  Patient: Megan Landry  Procedure(s) Performed: Procedure(s) with comments: RIGHT BREAST LUMPECTOMY WITH RADIOACTIVE SEED LOCALIZATION (Right) - RIGHT BREAST LUMPECTOMY WITH RADIOACTIVE SEED LOCALIZATION  Patient Location: PACU  Anesthesia Type:General  Level of Consciousness: awake and patient cooperative  Airway & Oxygen Therapy: Patient Spontanous Breathing and Patient connected to face mask oxygen  Post-op Assessment: Report given to RN and Post -op Vital signs reviewed and stable  Post vital signs: Reviewed and stable  Last Vitals:  Filed Vitals:   10/19/15 0840 10/19/15 0841  BP: 137/80   Pulse:  87  Temp:    Resp:  15    Last Pain: There were no vitals filed for this visit.       Complications: No apparent anesthesia complications

## 2015-10-19 NOTE — Discharge Instructions (Signed)
[facility] °Office Phone Number [phone] ° °BREAST BIOPSY/ PARTIAL MASTECTOMY: POST OP INSTRUCTIONS ° °Always review your discharge instruction sheet given to you by the facility where your surgery was performed. ° °IF YOU HAVE DISABILITY OR FAMILY LEAVE FORMS, YOU MUST BRING THEM TO THE OFFICE FOR PROCESSING.  DO NOT GIVE THEM TO YOUR DOCTOR. ° °1. A prescription for pain medication may be given to you upon discharge.  Take your pain medication as prescribed, if needed.  If narcotic pain medicine is not needed, then you may take acetaminophen (Tylenol) or ibuprofen (Advil) as needed. °2. Take your usually prescribed medications unless otherwise directed °3. If you need a refill on your pain medication, please contact your pharmacy.  They will contact our office to request authorization.  Prescriptions will not be filled after 5pm or on week-ends. °4. You should eat very light the first 24 hours after surgery, such as soup, crackers, pudding, etc.  Resume your normal diet the day after surgery. °5. Most patients will experience some swelling and bruising in the breast.  Ice packs and a good support bra will help.  Swelling and bruising can take several days to resolve.  °6. It is common to experience some constipation if taking pain medication after surgery.  Increasing fluid intake and taking a stool softener will usually help or prevent this problem from occurring.  A mild laxative (Milk of Magnesia or Miralax) should be taken according to package directions if there are no bowel movements after 48 hours. °7. Unless discharge instructions indicate otherwise, you may remove your bandages 24-48 hours after surgery, and you may shower at that time.  You may have steri-strips (small skin tapes) in place directly over the incision.  These strips should be left on the skin for 7-10 days.  If your surgeon used skin glue on the incision, you may shower in 24 hours.  The glue will flake off over the  next 2-3 weeks.  Any sutures or staples will be removed at the office during your follow-up visit. °8. ACTIVITIES:  You may resume regular daily activities (gradually increasing) beginning the next day.  Wearing a good support bra or sports bra minimizes pain and swelling.  You may have sexual intercourse when it is comfortable. °a. You may drive when you no longer are taking prescription pain medication, you can comfortably wear a seatbelt, and you can safely maneuver your car and apply brakes. °b. RETURN TO WORK:  ______________________________________________________________________________________ °9. You should see your doctor in the office for a follow-up appointment approximately two weeks after your surgery.  Your doctor’s nurse will typically make your follow-up appointment when she calls you with your pathology report.  Expect your pathology report 2-3 business days after your surgery.  You may call to check if you do not hear from us after three days. °10. OTHER INSTRUCTIONS: _______________________________________________________________________________________________ _____________________________________________________________________________________________________________________________________ °_____________________________________________________________________________________________________________________________________ °_____________________________________________________________________________________________________________________________________ ° °WHEN TO CALL YOUR DOCTOR: °1. Fever over 101.0 °2. Nausea and/or vomiting. °3. Extreme swelling or bruising. °4. Continued bleeding from incision. °5. Increased pain, redness, or drainage from the incision. ° °The clinic staff is available to answer your questions during regular business hours.  Please don’t hesitate to call and ask to speak to one of the nurses for clinical concerns.  If you have a medical emergency, go to the nearest  emergency room or call 911.  A surgeon from [facility] is always on call at the hospital. ° °For further questions, please visit [email or web address]  ° ° ° °  Post Anesthesia Home Care Instructions ° °Activity: °Get plenty of rest for the remainder of the day. A responsible adult should stay with you for 24 hours following the procedure.  °For the next 24 hours, DO NOT: °-Drive a car °-Operate machinery °-Drink alcoholic beverages °-Take any medication unless instructed by your physician °-Make any legal decisions or sign important papers. ° °Meals: °Start with liquid foods such as gelatin or soup. Progress to regular foods as tolerated. Avoid greasy, spicy, heavy foods. If nausea and/or vomiting occur, drink only clear liquids until the nausea and/or vomiting subsides. Call your physician if vomiting continues. ° °Special Instructions/Symptoms: °Your throat may feel dry or sore from the anesthesia or the breathing tube placed in your throat during surgery. If this causes discomfort, gargle with warm salt water. The discomfort should disappear within 24 hours. ° °If you had a scopolamine patch placed behind your ear for the management of post- operative nausea and/or vomiting: ° °1. The medication in the patch is effective for 72 hours, after which it should be removed.  Wrap patch in a tissue and discard in the trash. Wash hands thoroughly with soap and water. °2. You may remove the patch earlier than 72 hours if you experience unpleasant side effects which may include dry mouth, dizziness or visual disturbances. °3. Avoid touching the patch. Wash your hands with soap and water after contact with the patch. °  ° °

## 2015-10-19 NOTE — Anesthesia Preprocedure Evaluation (Signed)
Anesthesia Evaluation  Patient identified by MRN, date of birth, ID band Patient awake    Reviewed: Allergy & Precautions, NPO status , Patient's Chart, lab work & pertinent test results  Airway Mallampati: II  TM Distance: >3 FB Neck ROM: Full    Dental no notable dental hx. (+) Teeth Intact   Pulmonary Current Smoker,    Pulmonary exam normal breath sounds clear to auscultation       Cardiovascular negative cardio ROS Normal cardiovascular exam Rhythm:Regular Rate:Normal     Neuro/Psych PSYCHIATRIC DISORDERS Depression Hx/o MS  Neuromuscular disease    GI/Hepatic negative GI ROS, Neg liver ROS,   Endo/Other  Hyperlipidemia  Renal/GU negative Renal ROS  negative genitourinary   Musculoskeletal negative musculoskeletal ROS (+)   Abdominal   Peds  Hematology negative hematology ROS (+)   Anesthesia Other Findings   Reproductive/Obstetrics negative OB ROS                             Anesthesia Physical Anesthesia Plan  ASA: II  Anesthesia Plan: General   Post-op Pain Management:    Induction: Intravenous  Airway Management Planned: LMA  Additional Equipment:   Intra-op Plan:   Post-operative Plan: Extubation in OR  Informed Consent: I have reviewed the patients History and Physical, chart, labs and discussed the procedure including the risks, benefits and alternatives for the proposed anesthesia with the patient or authorized representative who has indicated his/her understanding and acceptance.   Dental advisory given  Plan Discussed with: Anesthesiologist, CRNA and Surgeon  Anesthesia Plan Comments:         Anesthesia Quick Evaluation

## 2015-10-19 NOTE — Op Note (Signed)
Patient Name:           WILLIAM TORSIELLO   Date of Surgery:        10/19/2015  Pre op Diagnosis:      Abnormal mammogram and complex sclerosing lesion right breast  Post op Diagnosis:    Same  Procedure:                 Right breast lumpectomy with radioactive seed localization, reexcision medial margin  Surgeon:                     Angelia Mould. Derrell Lolling, M.D., FACS  Assistant:                      OR staff  Operative Indications: . This is a 65 year old Caucasian female, referred by Dr. Baird Lyons at the [facility] for evaluation of a complex sclerosing lesion in the right breast. Ma Hillock, PA is her PCP.      She has no prior history of breast problems. Gets annual mammograms. Recent screening mammograms led to further diagnostic mammograms and ultrasound of the right breast. These reveal a 4.5 cm area of density and distortion in the posterior third of the right breast at the 12:30 position. There was no ultrasound correlate. This was felt to be suspicious for cancer versus complex sclerosing lesion. Image guided biopsy shows radial scar, complex sclerosing lesion, calcifications.     Family history reveals that 3 maternal aunts had breast cancer and one niece had breast cancer and bilateral mastectomies. Her mother does not have cancer. There is no family history of ovarian cancer, pancreatic cancer or colon cancer.     Comorbidities include multiple sclerosis, osteoporosis, and depression.     Considering the histology and her family history, we both felt that it was reasonable and desirable to go ahead with conservative lumpectomy of this area to rule out an upgrade lesion. She will be scheduled for right breast lumpectomy with radioactive seed localization in the near future.   Operative Findings:       The radioactive seed and marker clip were in the posterior central breast.  I was able to hear the signal in the holding area using the neoprobe.  I was able to make a  circumareolar head and scar technique incision but had to go for posterior in the breast.  The specimen mammogram showed the radioactive seed in the marker clip.  Following discussion of the original specimen mammogram, Dr. Azucena Kuba stated that the seed and clip were actually lateral to some of the area in question and so at Dr. Azucena Kuba suggestion I re-excised the medial margin as a separate specimen.  Procedure in Detail:          Following the induction of general LMA anesthesia the patient's right breast was prepped and draped in a sterile fashion.  Surgical timeout was performed.  Intravenous antibiotics were given.  0.5% Marcaine with epinephrine was used as a local infiltration anesthetic.      Using the neoprobe  I identified the maximum radioactivity at the lateral margin of the areola and so I made a circumareolar incision laterally.  Dissection was carried posteriorly into the posterior third of the breast excising the specimen, using the neoprobe as a guide all along.  The specimen showed the radioactive seed and  the clip in the posterior aspect of the specimen.  Following discussion with radiology I re-excised the medial margin and  sent that as a separate specimen.  Hemostasis was excellent and achieved with electrocautery.  Metal clips were placed in the lumpectomy wound.  The breast tissues were closed in several layers with interrupted sutures of 2-0 and 3-0 Vicryl.  The skin was closed with a running subcuticular 4-0 Monocryl and Dermabond.  Breast binder was placed and the patient taken to PACU in stable condition.  EBL 15-20 mL.  Counts correct.  Complications none.     Angelia Mould. Derrell Lolling, M.D., FACS General and Minimally Invasive Surgery Breast and Colorectal Surgery  10/19/2015 8:30 AM

## 2015-10-19 NOTE — Anesthesia Postprocedure Evaluation (Signed)
Anesthesia Post Note  Patient: Megan Landry  Procedure(s) Performed: Procedure(s) (LRB): RIGHT BREAST LUMPECTOMY WITH RADIOACTIVE SEED LOCALIZATION (Right)  Patient location during evaluation: PACU Anesthesia Type: General Level of consciousness: awake and alert and oriented Pain management: pain level controlled Vital Signs Assessment: post-procedure vital signs reviewed and stable Respiratory status: spontaneous breathing, nonlabored ventilation and respiratory function stable Cardiovascular status: blood pressure returned to baseline and stable Postop Assessment: no signs of nausea or vomiting Anesthetic complications: no    Last Vitals:  Filed Vitals:   10/19/15 0910 10/19/15 0920  BP:  138/75  Pulse: 81 88  Temp:  36.6 C  Resp: 14 16    Last Pain:  Filed Vitals:   10/19/15 0926  PainSc: 0-No pain                 Megan Landry A.

## 2015-10-22 ENCOUNTER — Encounter (HOSPITAL_BASED_OUTPATIENT_CLINIC_OR_DEPARTMENT_OTHER): Payer: Self-pay | Admitting: General Surgery

## 2015-10-24 NOTE — Progress Notes (Signed)
Quick Note:  Inform patient of Pathology report,.Biopsies show only benign findings. Radial scar and fibrocystic changes. Na cancer./ No atypia. Excellent news. Will discuss in detail at next OV.  hmi ______

## 2015-12-28 DIAGNOSIS — Z23 Encounter for immunization: Secondary | ICD-10-CM | POA: Diagnosis not present

## 2016-02-01 DIAGNOSIS — L309 Dermatitis, unspecified: Secondary | ICD-10-CM | POA: Diagnosis not present

## 2016-02-29 DIAGNOSIS — G35 Multiple sclerosis: Secondary | ICD-10-CM | POA: Diagnosis not present

## 2016-02-29 DIAGNOSIS — E7800 Pure hypercholesterolemia, unspecified: Secondary | ICD-10-CM | POA: Diagnosis not present

## 2016-02-29 DIAGNOSIS — Z0000 Encounter for general adult medical examination without abnormal findings: Secondary | ICD-10-CM | POA: Diagnosis not present

## 2016-02-29 DIAGNOSIS — M8580 Other specified disorders of bone density and structure, unspecified site: Secondary | ICD-10-CM | POA: Diagnosis not present

## 2016-03-26 DIAGNOSIS — M8588 Other specified disorders of bone density and structure, other site: Secondary | ICD-10-CM | POA: Diagnosis not present

## 2016-03-26 DIAGNOSIS — L570 Actinic keratosis: Secondary | ICD-10-CM | POA: Diagnosis not present

## 2016-07-14 DIAGNOSIS — Z23 Encounter for immunization: Secondary | ICD-10-CM | POA: Diagnosis not present

## 2016-07-24 DIAGNOSIS — L570 Actinic keratosis: Secondary | ICD-10-CM | POA: Diagnosis not present

## 2016-08-25 ENCOUNTER — Other Ambulatory Visit: Payer: Self-pay | Admitting: Physician Assistant

## 2016-08-25 DIAGNOSIS — R143 Flatulence: Secondary | ICD-10-CM | POA: Diagnosis not present

## 2016-08-25 DIAGNOSIS — R140 Abdominal distension (gaseous): Secondary | ICD-10-CM | POA: Diagnosis not present

## 2016-08-25 DIAGNOSIS — Z79899 Other long term (current) drug therapy: Secondary | ICD-10-CM | POA: Diagnosis not present

## 2016-08-25 DIAGNOSIS — R42 Dizziness and giddiness: Secondary | ICD-10-CM | POA: Diagnosis not present

## 2016-09-02 ENCOUNTER — Ambulatory Visit
Admission: RE | Admit: 2016-09-02 | Discharge: 2016-09-02 | Disposition: A | Discharge status: Home / Self Care | Payer: BLUE CROSS/BLUE SHIELD | Source: Ambulatory Visit | Attending: Physician Assistant | Admitting: Physician Assistant

## 2016-09-02 DIAGNOSIS — R140 Abdominal distension (gaseous): Secondary | ICD-10-CM

## 2016-10-16 ENCOUNTER — Other Ambulatory Visit (HOSPITAL_COMMUNITY): Payer: Self-pay | Admitting: Neurology

## 2016-10-16 DIAGNOSIS — I729 Aneurysm of unspecified site: Secondary | ICD-10-CM

## 2016-10-23 ENCOUNTER — Ambulatory Visit (HOSPITAL_COMMUNITY)
Admission: RE | Admit: 2016-10-23 | Discharge: 2016-10-23 | Disposition: A | Discharge status: Home / Self Care | Payer: BLUE CROSS/BLUE SHIELD | Source: Ambulatory Visit | Attending: Neurology | Admitting: Neurology

## 2016-10-23 DIAGNOSIS — G35 Multiple sclerosis: Secondary | ICD-10-CM | POA: Insufficient documentation

## 2016-10-23 DIAGNOSIS — I6789 Other cerebrovascular disease: Secondary | ICD-10-CM | POA: Diagnosis not present

## 2016-10-23 DIAGNOSIS — I729 Aneurysm of unspecified site: Secondary | ICD-10-CM | POA: Diagnosis not present

## 2016-10-23 LAB — POCT I-STAT CREATININE: CREATININE: 0.8 mg/dL (ref 0.44–1.00)

## 2016-10-23 MED ORDER — IOPAMIDOL (ISOVUE-370) INJECTION 76%
100.0000 mL | Freq: Once | INTRAVENOUS | Status: AC | PRN
  Administered 2016-10-23: 100 mL via INTRAVENOUS

## 2016-10-23 MED ORDER — IOPAMIDOL (ISOVUE-370) INJECTION 76%
INTRAVENOUS | Status: AC
  Filled 2016-10-23: qty 100

## 2016-12-18 DIAGNOSIS — M25511 Pain in right shoulder: Secondary | ICD-10-CM | POA: Diagnosis not present

## 2016-12-19 DIAGNOSIS — F17210 Nicotine dependence, cigarettes, uncomplicated: Secondary | ICD-10-CM | POA: Diagnosis not present

## 2016-12-19 DIAGNOSIS — I671 Cerebral aneurysm, nonruptured: Secondary | ICD-10-CM | POA: Diagnosis not present

## 2017-01-05 DIAGNOSIS — Z23 Encounter for immunization: Secondary | ICD-10-CM | POA: Diagnosis not present

## 2017-03-10 ENCOUNTER — Other Ambulatory Visit (HOSPITAL_COMMUNITY)
Admission: RE | Admit: 2017-03-10 | Discharge: 2017-03-10 | Disposition: A | Discharge status: Home / Self Care | Payer: BLUE CROSS/BLUE SHIELD | Source: Ambulatory Visit | Attending: Family Medicine | Admitting: Family Medicine

## 2017-03-10 ENCOUNTER — Other Ambulatory Visit: Payer: Self-pay | Admitting: Physician Assistant

## 2017-03-10 DIAGNOSIS — Z124 Encounter for screening for malignant neoplasm of cervix: Secondary | ICD-10-CM | POA: Diagnosis not present

## 2017-03-10 DIAGNOSIS — Z01419 Encounter for gynecological examination (general) (routine) without abnormal findings: Secondary | ICD-10-CM | POA: Diagnosis not present

## 2017-03-10 DIAGNOSIS — E7800 Pure hypercholesterolemia, unspecified: Secondary | ICD-10-CM | POA: Diagnosis not present

## 2017-03-10 DIAGNOSIS — Z0000 Encounter for general adult medical examination without abnormal findings: Secondary | ICD-10-CM | POA: Diagnosis not present

## 2017-03-10 DIAGNOSIS — M8580 Other specified disorders of bone density and structure, unspecified site: Secondary | ICD-10-CM | POA: Diagnosis not present

## 2017-03-10 DIAGNOSIS — F329 Major depressive disorder, single episode, unspecified: Secondary | ICD-10-CM | POA: Diagnosis not present

## 2017-03-11 LAB — CYTOLOGY - PAP: DIAGNOSIS: NEGATIVE

## 2017-03-19 DIAGNOSIS — M25511 Pain in right shoulder: Secondary | ICD-10-CM | POA: Diagnosis not present

## 2017-03-19 DIAGNOSIS — M799 Soft tissue disorder, unspecified: Secondary | ICD-10-CM | POA: Diagnosis not present

## 2017-03-19 DIAGNOSIS — M6250 Muscle wasting and atrophy, not elsewhere classified, unspecified site: Secondary | ICD-10-CM | POA: Diagnosis not present

## 2017-03-19 DIAGNOSIS — M7099 Unspecified soft tissue disorder related to use, overuse and pressure multiple sites: Secondary | ICD-10-CM | POA: Diagnosis not present

## 2017-03-24 DIAGNOSIS — M799 Soft tissue disorder, unspecified: Secondary | ICD-10-CM | POA: Diagnosis not present

## 2017-03-24 DIAGNOSIS — M6250 Muscle wasting and atrophy, not elsewhere classified, unspecified site: Secondary | ICD-10-CM | POA: Diagnosis not present

## 2017-03-24 DIAGNOSIS — M7099 Unspecified soft tissue disorder related to use, overuse and pressure multiple sites: Secondary | ICD-10-CM | POA: Diagnosis not present

## 2017-03-24 DIAGNOSIS — M25511 Pain in right shoulder: Secondary | ICD-10-CM | POA: Diagnosis not present

## 2017-03-26 DIAGNOSIS — M6250 Muscle wasting and atrophy, not elsewhere classified, unspecified site: Secondary | ICD-10-CM | POA: Diagnosis not present

## 2017-03-26 DIAGNOSIS — M799 Soft tissue disorder, unspecified: Secondary | ICD-10-CM | POA: Diagnosis not present

## 2017-03-26 DIAGNOSIS — M25511 Pain in right shoulder: Secondary | ICD-10-CM | POA: Diagnosis not present

## 2017-03-26 DIAGNOSIS — M7099 Unspecified soft tissue disorder related to use, overuse and pressure multiple sites: Secondary | ICD-10-CM | POA: Diagnosis not present

## 2017-04-20 DIAGNOSIS — M6250 Muscle wasting and atrophy, not elsewhere classified, unspecified site: Secondary | ICD-10-CM | POA: Diagnosis not present

## 2017-04-20 DIAGNOSIS — M7099 Unspecified soft tissue disorder related to use, overuse and pressure multiple sites: Secondary | ICD-10-CM | POA: Diagnosis not present

## 2017-04-20 DIAGNOSIS — M25511 Pain in right shoulder: Secondary | ICD-10-CM | POA: Diagnosis not present

## 2017-04-20 DIAGNOSIS — M799 Soft tissue disorder, unspecified: Secondary | ICD-10-CM | POA: Diagnosis not present

## 2017-05-07 DIAGNOSIS — M7099 Unspecified soft tissue disorder related to use, overuse and pressure multiple sites: Secondary | ICD-10-CM | POA: Diagnosis not present

## 2017-05-07 DIAGNOSIS — M25511 Pain in right shoulder: Secondary | ICD-10-CM | POA: Diagnosis not present

## 2017-05-07 DIAGNOSIS — M799 Soft tissue disorder, unspecified: Secondary | ICD-10-CM | POA: Diagnosis not present

## 2017-05-07 DIAGNOSIS — M6250 Muscle wasting and atrophy, not elsewhere classified, unspecified site: Secondary | ICD-10-CM | POA: Diagnosis not present

## 2017-05-12 DIAGNOSIS — M6250 Muscle wasting and atrophy, not elsewhere classified, unspecified site: Secondary | ICD-10-CM | POA: Diagnosis not present

## 2017-05-12 DIAGNOSIS — M799 Soft tissue disorder, unspecified: Secondary | ICD-10-CM | POA: Diagnosis not present

## 2017-05-12 DIAGNOSIS — M25511 Pain in right shoulder: Secondary | ICD-10-CM | POA: Diagnosis not present

## 2017-05-12 DIAGNOSIS — M7099 Unspecified soft tissue disorder related to use, overuse and pressure multiple sites: Secondary | ICD-10-CM | POA: Diagnosis not present

## 2017-06-23 DIAGNOSIS — L819 Disorder of pigmentation, unspecified: Secondary | ICD-10-CM | POA: Diagnosis not present

## 2017-07-06 DIAGNOSIS — L814 Other melanin hyperpigmentation: Secondary | ICD-10-CM | POA: Diagnosis not present

## 2017-07-06 DIAGNOSIS — L570 Actinic keratosis: Secondary | ICD-10-CM | POA: Diagnosis not present

## 2017-07-06 DIAGNOSIS — X32XXXS Exposure to sunlight, sequela: Secondary | ICD-10-CM | POA: Diagnosis not present

## 2017-08-12 DIAGNOSIS — M546 Pain in thoracic spine: Secondary | ICD-10-CM | POA: Diagnosis not present

## 2017-08-12 DIAGNOSIS — M542 Cervicalgia: Secondary | ICD-10-CM | POA: Diagnosis not present

## 2017-08-12 DIAGNOSIS — M9902 Segmental and somatic dysfunction of thoracic region: Secondary | ICD-10-CM | POA: Diagnosis not present

## 2017-08-12 DIAGNOSIS — M9901 Segmental and somatic dysfunction of cervical region: Secondary | ICD-10-CM | POA: Diagnosis not present

## 2017-08-18 DIAGNOSIS — M9901 Segmental and somatic dysfunction of cervical region: Secondary | ICD-10-CM | POA: Diagnosis not present

## 2017-08-18 DIAGNOSIS — M546 Pain in thoracic spine: Secondary | ICD-10-CM | POA: Diagnosis not present

## 2017-08-18 DIAGNOSIS — M9902 Segmental and somatic dysfunction of thoracic region: Secondary | ICD-10-CM | POA: Diagnosis not present

## 2017-08-18 DIAGNOSIS — M542 Cervicalgia: Secondary | ICD-10-CM | POA: Diagnosis not present

## 2017-08-20 DIAGNOSIS — M546 Pain in thoracic spine: Secondary | ICD-10-CM | POA: Diagnosis not present

## 2017-08-20 DIAGNOSIS — M9902 Segmental and somatic dysfunction of thoracic region: Secondary | ICD-10-CM | POA: Diagnosis not present

## 2017-08-20 DIAGNOSIS — M542 Cervicalgia: Secondary | ICD-10-CM | POA: Diagnosis not present

## 2017-08-20 DIAGNOSIS — M9901 Segmental and somatic dysfunction of cervical region: Secondary | ICD-10-CM | POA: Diagnosis not present

## 2017-09-03 DIAGNOSIS — M9901 Segmental and somatic dysfunction of cervical region: Secondary | ICD-10-CM | POA: Diagnosis not present

## 2017-09-03 DIAGNOSIS — M546 Pain in thoracic spine: Secondary | ICD-10-CM | POA: Diagnosis not present

## 2017-09-03 DIAGNOSIS — M9902 Segmental and somatic dysfunction of thoracic region: Secondary | ICD-10-CM | POA: Diagnosis not present

## 2017-09-03 DIAGNOSIS — M542 Cervicalgia: Secondary | ICD-10-CM | POA: Diagnosis not present

## 2017-09-08 ENCOUNTER — Other Ambulatory Visit: Payer: Self-pay | Admitting: Physician Assistant

## 2017-09-08 DIAGNOSIS — Z1231 Encounter for screening mammogram for malignant neoplasm of breast: Secondary | ICD-10-CM

## 2017-09-08 DIAGNOSIS — M9901 Segmental and somatic dysfunction of cervical region: Secondary | ICD-10-CM | POA: Diagnosis not present

## 2017-09-08 DIAGNOSIS — M542 Cervicalgia: Secondary | ICD-10-CM | POA: Diagnosis not present

## 2017-09-08 DIAGNOSIS — M9902 Segmental and somatic dysfunction of thoracic region: Secondary | ICD-10-CM | POA: Diagnosis not present

## 2017-09-08 DIAGNOSIS — M546 Pain in thoracic spine: Secondary | ICD-10-CM | POA: Diagnosis not present

## 2017-09-10 DIAGNOSIS — M9901 Segmental and somatic dysfunction of cervical region: Secondary | ICD-10-CM | POA: Diagnosis not present

## 2017-09-10 DIAGNOSIS — M9902 Segmental and somatic dysfunction of thoracic region: Secondary | ICD-10-CM | POA: Diagnosis not present

## 2017-09-10 DIAGNOSIS — R293 Abnormal posture: Secondary | ICD-10-CM | POA: Diagnosis not present

## 2017-09-10 DIAGNOSIS — M546 Pain in thoracic spine: Secondary | ICD-10-CM | POA: Diagnosis not present

## 2017-09-10 DIAGNOSIS — M542 Cervicalgia: Secondary | ICD-10-CM | POA: Diagnosis not present

## 2017-09-10 DIAGNOSIS — M7071 Other bursitis of hip, right hip: Secondary | ICD-10-CM | POA: Diagnosis not present

## 2017-09-15 DIAGNOSIS — R293 Abnormal posture: Secondary | ICD-10-CM | POA: Diagnosis not present

## 2017-09-15 DIAGNOSIS — M546 Pain in thoracic spine: Secondary | ICD-10-CM | POA: Diagnosis not present

## 2017-09-15 DIAGNOSIS — M9901 Segmental and somatic dysfunction of cervical region: Secondary | ICD-10-CM | POA: Diagnosis not present

## 2017-09-15 DIAGNOSIS — M542 Cervicalgia: Secondary | ICD-10-CM | POA: Diagnosis not present

## 2017-09-15 DIAGNOSIS — M7071 Other bursitis of hip, right hip: Secondary | ICD-10-CM | POA: Diagnosis not present

## 2017-09-15 DIAGNOSIS — M9902 Segmental and somatic dysfunction of thoracic region: Secondary | ICD-10-CM | POA: Diagnosis not present

## 2017-09-17 DIAGNOSIS — M7071 Other bursitis of hip, right hip: Secondary | ICD-10-CM | POA: Diagnosis not present

## 2017-09-17 DIAGNOSIS — M9902 Segmental and somatic dysfunction of thoracic region: Secondary | ICD-10-CM | POA: Diagnosis not present

## 2017-09-17 DIAGNOSIS — M546 Pain in thoracic spine: Secondary | ICD-10-CM | POA: Diagnosis not present

## 2017-09-17 DIAGNOSIS — M9901 Segmental and somatic dysfunction of cervical region: Secondary | ICD-10-CM | POA: Diagnosis not present

## 2017-09-17 DIAGNOSIS — M542 Cervicalgia: Secondary | ICD-10-CM | POA: Diagnosis not present

## 2017-09-17 DIAGNOSIS — R293 Abnormal posture: Secondary | ICD-10-CM | POA: Diagnosis not present

## 2017-09-28 DIAGNOSIS — M9901 Segmental and somatic dysfunction of cervical region: Secondary | ICD-10-CM | POA: Diagnosis not present

## 2017-09-28 DIAGNOSIS — M7071 Other bursitis of hip, right hip: Secondary | ICD-10-CM | POA: Diagnosis not present

## 2017-09-28 DIAGNOSIS — R293 Abnormal posture: Secondary | ICD-10-CM | POA: Diagnosis not present

## 2017-09-28 DIAGNOSIS — M9902 Segmental and somatic dysfunction of thoracic region: Secondary | ICD-10-CM | POA: Diagnosis not present

## 2017-09-28 DIAGNOSIS — M542 Cervicalgia: Secondary | ICD-10-CM | POA: Diagnosis not present

## 2017-09-28 DIAGNOSIS — M546 Pain in thoracic spine: Secondary | ICD-10-CM | POA: Diagnosis not present

## 2017-09-29 DIAGNOSIS — M7071 Other bursitis of hip, right hip: Secondary | ICD-10-CM | POA: Diagnosis not present

## 2017-09-29 DIAGNOSIS — R293 Abnormal posture: Secondary | ICD-10-CM | POA: Diagnosis not present

## 2017-09-29 DIAGNOSIS — M542 Cervicalgia: Secondary | ICD-10-CM | POA: Diagnosis not present

## 2017-09-29 DIAGNOSIS — M9902 Segmental and somatic dysfunction of thoracic region: Secondary | ICD-10-CM | POA: Diagnosis not present

## 2017-09-29 DIAGNOSIS — M546 Pain in thoracic spine: Secondary | ICD-10-CM | POA: Diagnosis not present

## 2017-09-29 DIAGNOSIS — M9901 Segmental and somatic dysfunction of cervical region: Secondary | ICD-10-CM | POA: Diagnosis not present

## 2017-10-01 DIAGNOSIS — M9902 Segmental and somatic dysfunction of thoracic region: Secondary | ICD-10-CM | POA: Diagnosis not present

## 2017-10-01 DIAGNOSIS — M7071 Other bursitis of hip, right hip: Secondary | ICD-10-CM | POA: Diagnosis not present

## 2017-10-01 DIAGNOSIS — R293 Abnormal posture: Secondary | ICD-10-CM | POA: Diagnosis not present

## 2017-10-01 DIAGNOSIS — M9901 Segmental and somatic dysfunction of cervical region: Secondary | ICD-10-CM | POA: Diagnosis not present

## 2017-10-01 DIAGNOSIS — M546 Pain in thoracic spine: Secondary | ICD-10-CM | POA: Diagnosis not present

## 2017-10-01 DIAGNOSIS — M542 Cervicalgia: Secondary | ICD-10-CM | POA: Diagnosis not present

## 2017-10-05 ENCOUNTER — Ambulatory Visit
Admission: RE | Admit: 2017-10-05 | Discharge: 2017-10-05 | Disposition: A | Discharge status: Home / Self Care | Payer: BLUE CROSS/BLUE SHIELD | Source: Ambulatory Visit | Attending: Physician Assistant | Admitting: Physician Assistant

## 2017-10-05 DIAGNOSIS — Z1231 Encounter for screening mammogram for malignant neoplasm of breast: Secondary | ICD-10-CM | POA: Diagnosis not present

## 2017-10-07 ENCOUNTER — Other Ambulatory Visit: Payer: Self-pay | Admitting: Physician Assistant

## 2017-10-07 DIAGNOSIS — M7071 Other bursitis of hip, right hip: Secondary | ICD-10-CM | POA: Diagnosis not present

## 2017-10-07 DIAGNOSIS — M546 Pain in thoracic spine: Secondary | ICD-10-CM | POA: Diagnosis not present

## 2017-10-07 DIAGNOSIS — M542 Cervicalgia: Secondary | ICD-10-CM | POA: Diagnosis not present

## 2017-10-07 DIAGNOSIS — M9901 Segmental and somatic dysfunction of cervical region: Secondary | ICD-10-CM | POA: Diagnosis not present

## 2017-10-07 DIAGNOSIS — R928 Other abnormal and inconclusive findings on diagnostic imaging of breast: Secondary | ICD-10-CM

## 2017-10-07 DIAGNOSIS — R293 Abnormal posture: Secondary | ICD-10-CM | POA: Diagnosis not present

## 2017-10-07 DIAGNOSIS — M9902 Segmental and somatic dysfunction of thoracic region: Secondary | ICD-10-CM | POA: Diagnosis not present

## 2017-10-08 DIAGNOSIS — M542 Cervicalgia: Secondary | ICD-10-CM | POA: Diagnosis not present

## 2017-10-08 DIAGNOSIS — M546 Pain in thoracic spine: Secondary | ICD-10-CM | POA: Diagnosis not present

## 2017-10-08 DIAGNOSIS — M9902 Segmental and somatic dysfunction of thoracic region: Secondary | ICD-10-CM | POA: Diagnosis not present

## 2017-10-08 DIAGNOSIS — M7071 Other bursitis of hip, right hip: Secondary | ICD-10-CM | POA: Diagnosis not present

## 2017-10-08 DIAGNOSIS — M9901 Segmental and somatic dysfunction of cervical region: Secondary | ICD-10-CM | POA: Diagnosis not present

## 2017-10-13 DIAGNOSIS — M9902 Segmental and somatic dysfunction of thoracic region: Secondary | ICD-10-CM | POA: Diagnosis not present

## 2017-10-13 DIAGNOSIS — M546 Pain in thoracic spine: Secondary | ICD-10-CM | POA: Diagnosis not present

## 2017-10-13 DIAGNOSIS — M542 Cervicalgia: Secondary | ICD-10-CM | POA: Diagnosis not present

## 2017-10-13 DIAGNOSIS — M9901 Segmental and somatic dysfunction of cervical region: Secondary | ICD-10-CM | POA: Diagnosis not present

## 2017-10-13 DIAGNOSIS — R293 Abnormal posture: Secondary | ICD-10-CM | POA: Diagnosis not present

## 2017-10-13 DIAGNOSIS — M7071 Other bursitis of hip, right hip: Secondary | ICD-10-CM | POA: Diagnosis not present

## 2017-10-15 ENCOUNTER — Ambulatory Visit
Admission: RE | Admit: 2017-10-15 | Discharge: 2017-10-15 | Disposition: A | Discharge status: Home / Self Care | Payer: BLUE CROSS/BLUE SHIELD | Source: Ambulatory Visit | Attending: Physician Assistant | Admitting: Physician Assistant

## 2017-10-15 ENCOUNTER — Ambulatory Visit: Payer: BLUE CROSS/BLUE SHIELD

## 2017-10-15 DIAGNOSIS — R928 Other abnormal and inconclusive findings on diagnostic imaging of breast: Secondary | ICD-10-CM

## 2017-10-29 DIAGNOSIS — M542 Cervicalgia: Secondary | ICD-10-CM | POA: Diagnosis not present

## 2017-10-29 DIAGNOSIS — M7071 Other bursitis of hip, right hip: Secondary | ICD-10-CM | POA: Diagnosis not present

## 2017-10-29 DIAGNOSIS — R293 Abnormal posture: Secondary | ICD-10-CM | POA: Diagnosis not present

## 2017-10-29 DIAGNOSIS — M9901 Segmental and somatic dysfunction of cervical region: Secondary | ICD-10-CM | POA: Diagnosis not present

## 2017-10-29 DIAGNOSIS — M9902 Segmental and somatic dysfunction of thoracic region: Secondary | ICD-10-CM | POA: Diagnosis not present

## 2017-10-29 DIAGNOSIS — M546 Pain in thoracic spine: Secondary | ICD-10-CM | POA: Diagnosis not present

## 2017-11-19 DIAGNOSIS — J029 Acute pharyngitis, unspecified: Secondary | ICD-10-CM | POA: Diagnosis not present

## 2017-12-14 DIAGNOSIS — M9902 Segmental and somatic dysfunction of thoracic region: Secondary | ICD-10-CM | POA: Diagnosis not present

## 2017-12-14 DIAGNOSIS — M546 Pain in thoracic spine: Secondary | ICD-10-CM | POA: Diagnosis not present

## 2017-12-14 DIAGNOSIS — M9901 Segmental and somatic dysfunction of cervical region: Secondary | ICD-10-CM | POA: Diagnosis not present

## 2017-12-14 DIAGNOSIS — M542 Cervicalgia: Secondary | ICD-10-CM | POA: Diagnosis not present

## 2017-12-14 DIAGNOSIS — M7071 Other bursitis of hip, right hip: Secondary | ICD-10-CM | POA: Diagnosis not present

## 2017-12-14 DIAGNOSIS — R293 Abnormal posture: Secondary | ICD-10-CM | POA: Diagnosis not present

## 2018-01-06 DIAGNOSIS — Z882 Allergy status to sulfonamides status: Secondary | ICD-10-CM | POA: Diagnosis not present

## 2018-01-06 DIAGNOSIS — Z79899 Other long term (current) drug therapy: Secondary | ICD-10-CM | POA: Diagnosis not present

## 2018-01-06 DIAGNOSIS — F17210 Nicotine dependence, cigarettes, uncomplicated: Secondary | ICD-10-CM | POA: Diagnosis not present

## 2018-01-06 DIAGNOSIS — G35 Multiple sclerosis: Secondary | ICD-10-CM | POA: Diagnosis not present

## 2018-03-29 ENCOUNTER — Other Ambulatory Visit: Payer: Self-pay | Admitting: Physician Assistant

## 2018-03-29 DIAGNOSIS — M8580 Other specified disorders of bone density and structure, unspecified site: Secondary | ICD-10-CM

## 2018-03-29 DIAGNOSIS — F329 Major depressive disorder, single episode, unspecified: Secondary | ICD-10-CM | POA: Diagnosis not present

## 2018-03-29 DIAGNOSIS — E7800 Pure hypercholesterolemia, unspecified: Secondary | ICD-10-CM | POA: Diagnosis not present

## 2018-03-29 DIAGNOSIS — Z0000 Encounter for general adult medical examination without abnormal findings: Secondary | ICD-10-CM | POA: Diagnosis not present

## 2018-05-12 ENCOUNTER — Ambulatory Visit
Admission: RE | Admit: 2018-05-12 | Discharge: 2018-05-12 | Disposition: A | Discharge status: Home / Self Care | Payer: BLUE CROSS/BLUE SHIELD | Source: Ambulatory Visit | Attending: Physician Assistant | Admitting: Physician Assistant

## 2018-05-12 DIAGNOSIS — Z780 Asymptomatic menopausal state: Secondary | ICD-10-CM | POA: Diagnosis not present

## 2018-05-12 DIAGNOSIS — M8580 Other specified disorders of bone density and structure, unspecified site: Secondary | ICD-10-CM

## 2018-05-12 DIAGNOSIS — M85852 Other specified disorders of bone density and structure, left thigh: Secondary | ICD-10-CM | POA: Diagnosis not present

## 2018-07-15 DIAGNOSIS — R140 Abdominal distension (gaseous): Secondary | ICD-10-CM | POA: Diagnosis not present

## 2018-07-15 DIAGNOSIS — R194 Change in bowel habit: Secondary | ICD-10-CM | POA: Diagnosis not present

## 2018-07-19 DIAGNOSIS — R140 Abdominal distension (gaseous): Secondary | ICD-10-CM | POA: Diagnosis not present

## 2018-08-11 DIAGNOSIS — G35 Multiple sclerosis: Secondary | ICD-10-CM | POA: Diagnosis not present

## 2018-08-11 DIAGNOSIS — I729 Aneurysm of unspecified site: Secondary | ICD-10-CM | POA: Diagnosis not present

## 2018-09-30 DIAGNOSIS — I729 Aneurysm of unspecified site: Secondary | ICD-10-CM | POA: Diagnosis not present

## 2018-10-08 ENCOUNTER — Other Ambulatory Visit: Payer: Self-pay | Admitting: Physician Assistant

## 2018-10-08 DIAGNOSIS — Z1231 Encounter for screening mammogram for malignant neoplasm of breast: Secondary | ICD-10-CM

## 2018-10-25 DIAGNOSIS — R194 Change in bowel habit: Secondary | ICD-10-CM | POA: Diagnosis not present

## 2018-11-19 ENCOUNTER — Ambulatory Visit
Admission: RE | Admit: 2018-11-19 | Discharge: 2018-11-19 | Disposition: A | Discharge status: Home / Self Care | Payer: BC Managed Care – PPO | Source: Ambulatory Visit | Attending: Physician Assistant | Admitting: Physician Assistant

## 2018-11-19 ENCOUNTER — Other Ambulatory Visit: Payer: Self-pay

## 2018-11-19 DIAGNOSIS — Z1231 Encounter for screening mammogram for malignant neoplasm of breast: Secondary | ICD-10-CM

## 2018-12-06 DIAGNOSIS — K581 Irritable bowel syndrome with constipation: Secondary | ICD-10-CM | POA: Diagnosis not present

## 2019-02-08 DIAGNOSIS — R198 Other specified symptoms and signs involving the digestive system and abdomen: Secondary | ICD-10-CM | POA: Diagnosis not present

## 2019-02-08 DIAGNOSIS — R195 Other fecal abnormalities: Secondary | ICD-10-CM | POA: Diagnosis not present

## 2019-03-01 ENCOUNTER — Other Ambulatory Visit: Payer: Self-pay

## 2019-03-01 DIAGNOSIS — Z20822 Contact with and (suspected) exposure to covid-19: Secondary | ICD-10-CM

## 2019-03-01 DIAGNOSIS — J029 Acute pharyngitis, unspecified: Secondary | ICD-10-CM | POA: Diagnosis not present

## 2019-03-01 DIAGNOSIS — R0989 Other specified symptoms and signs involving the circulatory and respiratory systems: Secondary | ICD-10-CM | POA: Diagnosis not present

## 2019-03-01 DIAGNOSIS — R05 Cough: Secondary | ICD-10-CM | POA: Diagnosis not present

## 2019-03-03 LAB — NOVEL CORONAVIRUS, NAA: SARS-CoV-2, NAA: NOT DETECTED

## 2019-03-11 DIAGNOSIS — Z1159 Encounter for screening for other viral diseases: Secondary | ICD-10-CM | POA: Diagnosis not present

## 2019-04-04 DIAGNOSIS — F329 Major depressive disorder, single episode, unspecified: Secondary | ICD-10-CM | POA: Diagnosis not present

## 2019-04-04 DIAGNOSIS — Z0000 Encounter for general adult medical examination without abnormal findings: Secondary | ICD-10-CM | POA: Diagnosis not present

## 2019-04-04 DIAGNOSIS — K581 Irritable bowel syndrome with constipation: Secondary | ICD-10-CM | POA: Diagnosis not present

## 2019-04-04 DIAGNOSIS — M8580 Other specified disorders of bone density and structure, unspecified site: Secondary | ICD-10-CM | POA: Diagnosis not present

## 2019-04-04 DIAGNOSIS — E7800 Pure hypercholesterolemia, unspecified: Secondary | ICD-10-CM | POA: Diagnosis not present

## 2019-04-05 DIAGNOSIS — M859 Disorder of bone density and structure, unspecified: Secondary | ICD-10-CM | POA: Diagnosis not present

## 2019-04-05 DIAGNOSIS — E7800 Pure hypercholesterolemia, unspecified: Secondary | ICD-10-CM | POA: Diagnosis not present

## 2019-04-05 DIAGNOSIS — Z0000 Encounter for general adult medical examination without abnormal findings: Secondary | ICD-10-CM | POA: Diagnosis not present

## 2019-04-06 DIAGNOSIS — Z1159 Encounter for screening for other viral diseases: Secondary | ICD-10-CM | POA: Diagnosis not present

## 2019-04-11 DIAGNOSIS — R194 Change in bowel habit: Secondary | ICD-10-CM | POA: Diagnosis not present

## 2019-04-11 DIAGNOSIS — D122 Benign neoplasm of ascending colon: Secondary | ICD-10-CM | POA: Diagnosis not present

## 2019-04-11 DIAGNOSIS — K635 Polyp of colon: Secondary | ICD-10-CM | POA: Diagnosis not present

## 2019-04-11 DIAGNOSIS — K5730 Diverticulosis of large intestine without perforation or abscess without bleeding: Secondary | ICD-10-CM | POA: Diagnosis not present

## 2019-04-11 DIAGNOSIS — R195 Other fecal abnormalities: Secondary | ICD-10-CM | POA: Diagnosis not present

## 2019-06-11 ENCOUNTER — Ambulatory Visit: Payer: Medicare Other | Attending: Internal Medicine

## 2019-06-11 DIAGNOSIS — Z23 Encounter for immunization: Secondary | ICD-10-CM | POA: Insufficient documentation

## 2019-06-11 NOTE — Progress Notes (Signed)
   Covid-19 Vaccination Clinic  Name:  Megan Landry    MRN: [id number] DOB: Feb 09, 1951  06/11/2019  Megan Landry was observed post Covid-19 immunization for 15 minutes without incidence. She was provided with Vaccine Information Sheet and instruction to access the V-Safe system.   Megan Landry was instructed to call 911 with any severe reactions post vaccine: Marland Kitchen Difficulty breathing  . Swelling of your face and throat  . A fast heartbeat  . A bad rash all over your body  . Dizziness and weakness    Immunizations Administered    Name Date Dose VIS Date Route   Pfizer COVID-19 Vaccine 06/11/2019 12:45 PM 0.3 mL 04/01/2019 Intramuscular   Manufacturer: [employer], [employer]   Lot: [id number]   NDC: [address]-6

## 2019-07-05 ENCOUNTER — Ambulatory Visit: Payer: Medicare Other | Attending: Internal Medicine

## 2019-07-05 DIAGNOSIS — Z23 Encounter for immunization: Secondary | ICD-10-CM

## 2019-07-05 NOTE — Progress Notes (Signed)
   Covid-19 Vaccination Clinic  Name:  Megan Landry    MRN: [id number] DOB: 1950-05-24  07/05/2019  Ms. Warning was observed post Covid-19 immunization for 15 minutes without incident. She was provided with Vaccine Information Sheet and instruction to access the V-Safe system.   [name] was instructed to call 911 with any severe reactions post vaccine: Marland Kitchen Difficulty breathing  . Swelling of face and throat  . A fast heartbeat  . A bad rash all over body  . Dizziness and weakness   Immunizations Administered    Name Date Dose VIS Date Route   Pfizer COVID-19 Vaccine 07/05/2019 12:19 PM 0.3 mL 04/01/2019 Intramuscular   Manufacturer: [employer], [employer]   Lot: [id number]   NDC: [address]-1

## 2020-01-05 ENCOUNTER — Other Ambulatory Visit: Payer: Self-pay | Admitting: Physician Assistant

## 2020-01-05 DIAGNOSIS — Z1231 Encounter for screening mammogram for malignant neoplasm of breast: Secondary | ICD-10-CM

## 2020-01-19 ENCOUNTER — Other Ambulatory Visit: Payer: Self-pay

## 2020-01-19 ENCOUNTER — Ambulatory Visit
Admission: RE | Admit: 2020-01-19 | Discharge: 2020-01-19 | Disposition: A | Discharge status: Home / Self Care | Payer: Medicare Other | Source: Ambulatory Visit | Attending: Physician Assistant | Admitting: Physician Assistant

## 2020-01-19 DIAGNOSIS — Z1231 Encounter for screening mammogram for malignant neoplasm of breast: Secondary | ICD-10-CM

## 2020-05-02 DIAGNOSIS — R6883 Chills (without fever): Secondary | ICD-10-CM | POA: Diagnosis not present

## 2020-05-02 DIAGNOSIS — R0981 Nasal congestion: Secondary | ICD-10-CM | POA: Diagnosis not present

## 2020-06-08 DIAGNOSIS — M25551 Pain in right hip: Secondary | ICD-10-CM | POA: Diagnosis not present

## 2020-06-20 DIAGNOSIS — M25551 Pain in right hip: Secondary | ICD-10-CM | POA: Diagnosis not present

## 2020-06-27 DIAGNOSIS — M25551 Pain in right hip: Secondary | ICD-10-CM | POA: Diagnosis not present

## 2020-07-11 DIAGNOSIS — M25551 Pain in right hip: Secondary | ICD-10-CM | POA: Diagnosis not present

## 2020-07-18 DIAGNOSIS — M25551 Pain in right hip: Secondary | ICD-10-CM | POA: Diagnosis not present

## 2020-07-20 DIAGNOSIS — M25551 Pain in right hip: Secondary | ICD-10-CM | POA: Diagnosis not present

## 2020-07-24 DIAGNOSIS — M25551 Pain in right hip: Secondary | ICD-10-CM | POA: Diagnosis not present

## 2020-07-27 DIAGNOSIS — M25551 Pain in right hip: Secondary | ICD-10-CM | POA: Diagnosis not present

## 2020-07-31 DIAGNOSIS — M25551 Pain in right hip: Secondary | ICD-10-CM | POA: Diagnosis not present

## 2020-08-03 DIAGNOSIS — M25551 Pain in right hip: Secondary | ICD-10-CM | POA: Diagnosis not present

## 2020-08-08 DIAGNOSIS — M25551 Pain in right hip: Secondary | ICD-10-CM | POA: Diagnosis not present

## 2020-08-14 DIAGNOSIS — M25551 Pain in right hip: Secondary | ICD-10-CM | POA: Diagnosis not present

## 2020-08-21 DIAGNOSIS — M25551 Pain in right hip: Secondary | ICD-10-CM | POA: Diagnosis not present

## 2020-08-23 DIAGNOSIS — M25551 Pain in right hip: Secondary | ICD-10-CM | POA: Diagnosis not present

## 2020-08-27 DIAGNOSIS — M25551 Pain in right hip: Secondary | ICD-10-CM | POA: Diagnosis not present

## 2020-08-29 DIAGNOSIS — M25551 Pain in right hip: Secondary | ICD-10-CM | POA: Diagnosis not present

## 2022-08-30 IMAGING — MG DIGITAL SCREENING BILAT W/ TOMO W/ CAD
8 series · 9 of 24 positions shown · non-contrast
Comparison: Previous exam(s).

CLINICAL DATA: Screening.

EXAM:
DIGITAL SCREENING BILATERAL MAMMOGRAM WITH TOMO AND CAD

[R MLO synth-2D]
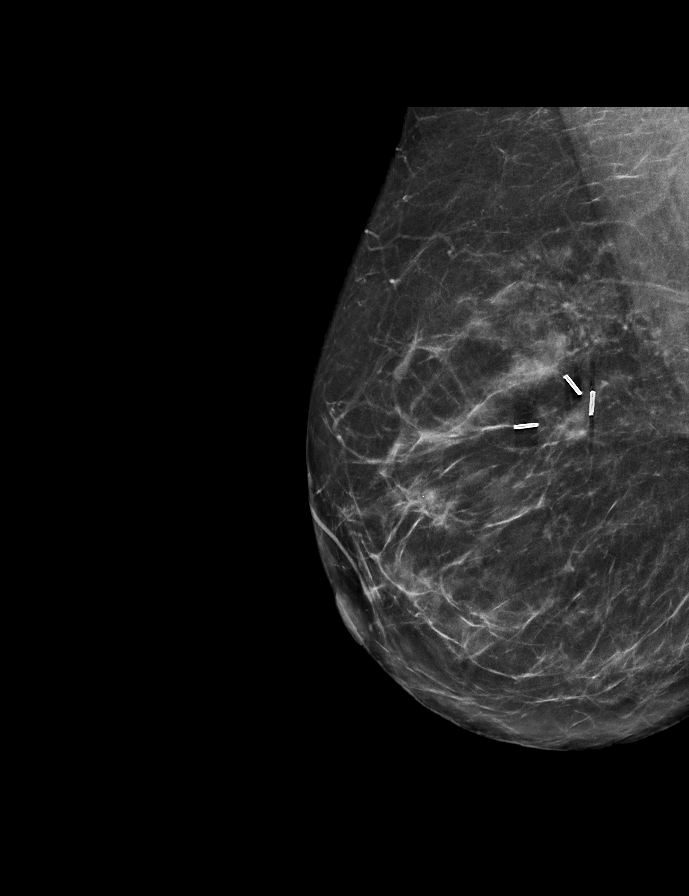

[L CC synth-2D]
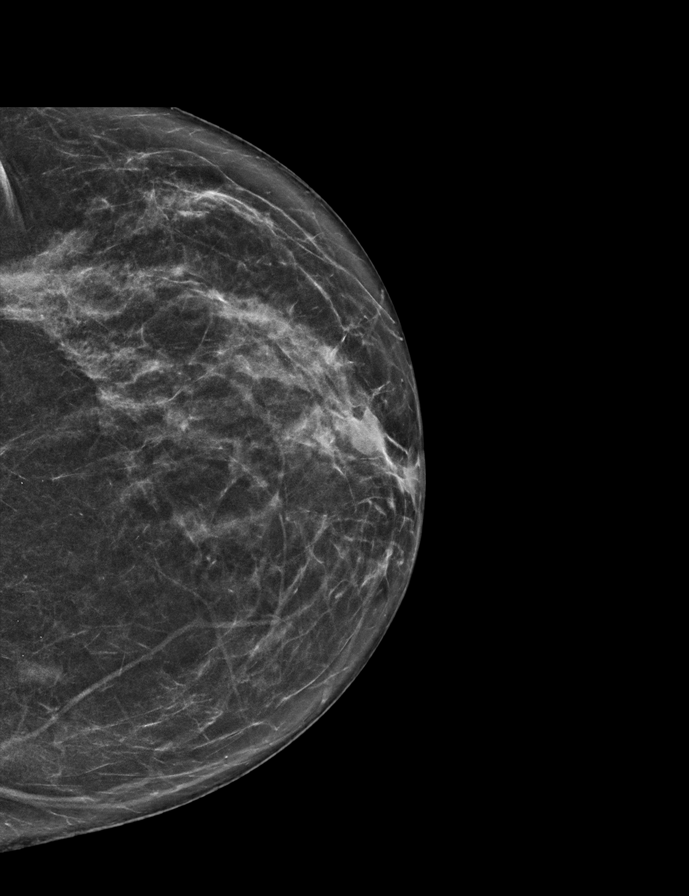

[R CC synth-2D]
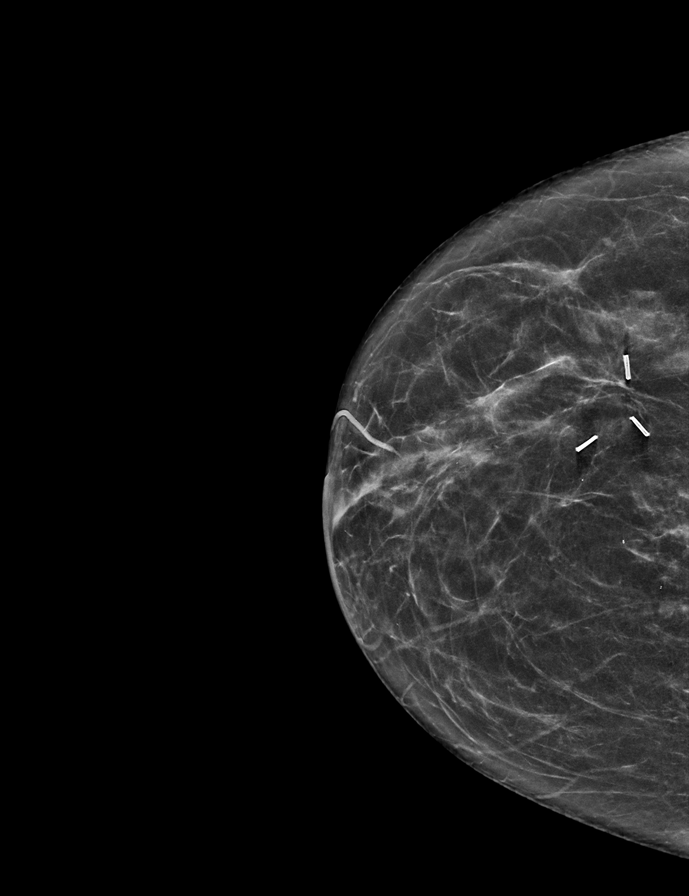

[L MLO synth-2D]
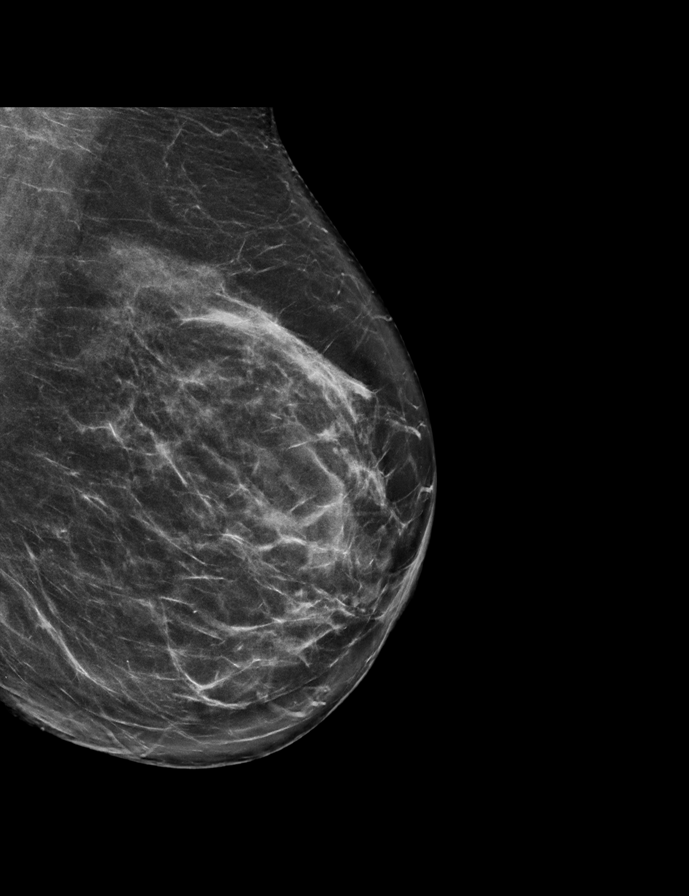

[R CC tomo · 2 of 59 frames shown]
[frame 20/59]
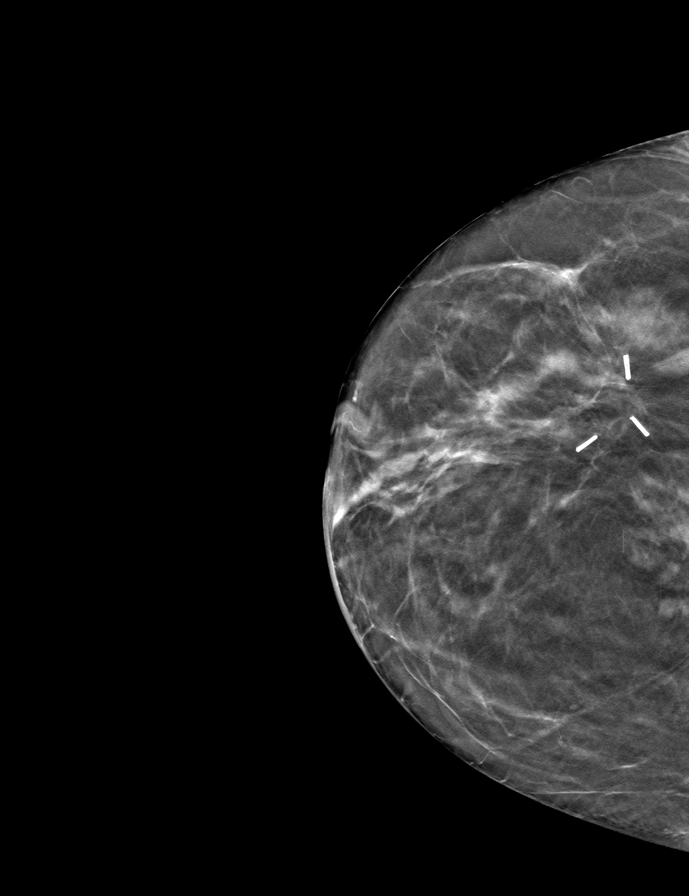
[frame 30/59]
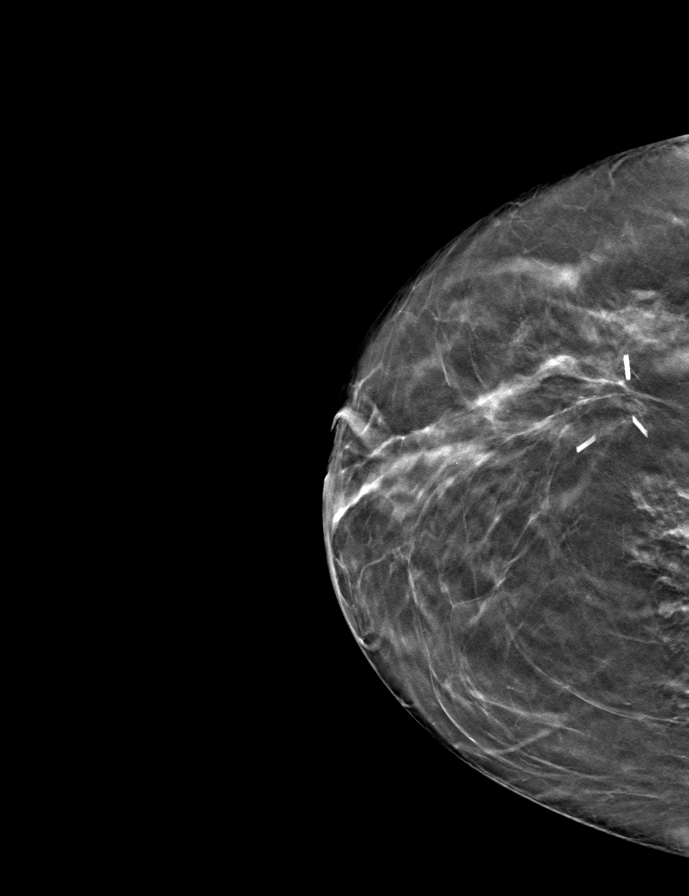

[R MLO tomo · tomo slice 35/68.0]
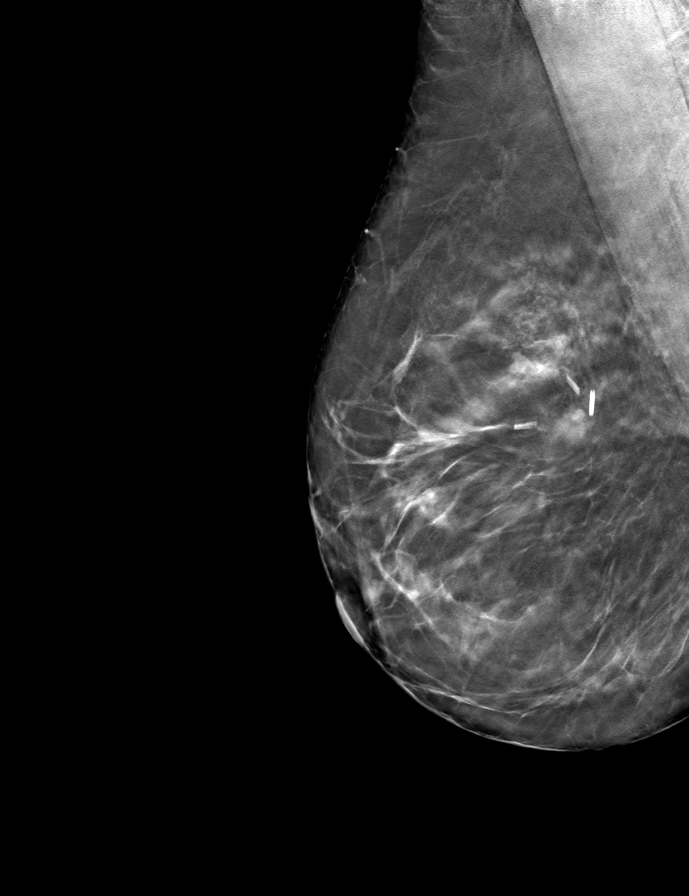

[L CC tomo · tomo slice 30/59.0]
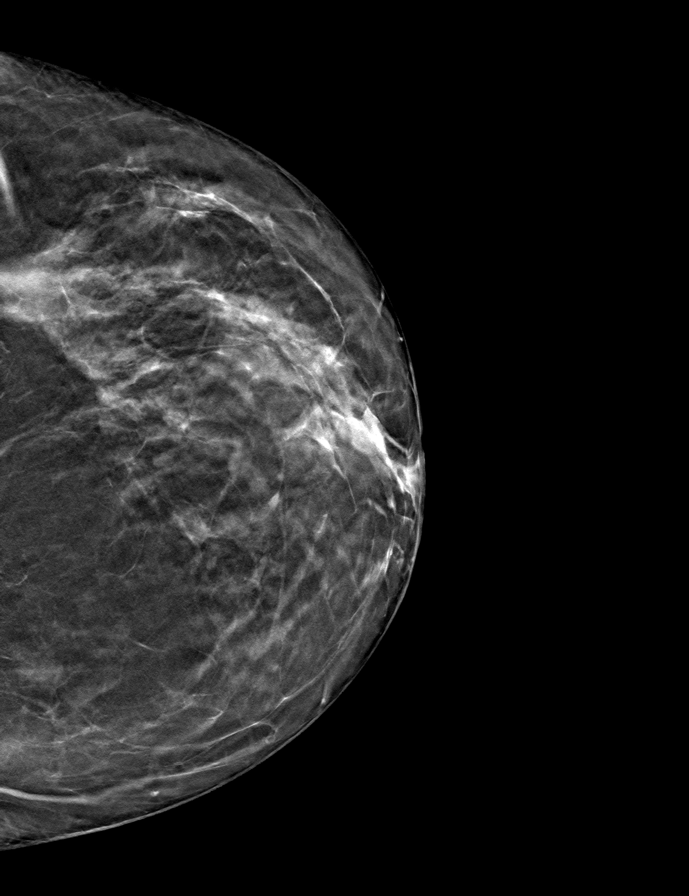

[L MLO tomo · tomo slice 36/71.0]
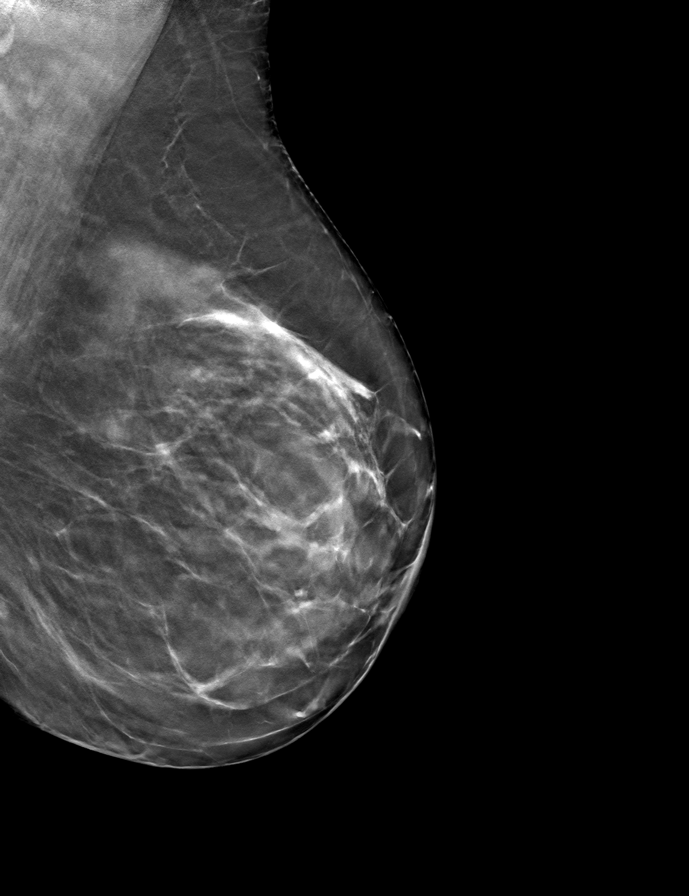

[9 of 24 positions shown; findings below may reference images not displayed]

ACR Breast Density Category c: The breast tissue is heterogeneously
dense, which may obscure small masses.
FINDINGS: There are no findings suspicious for malignancy. Images were
processed with CAD.
IMPRESSION: No mammographic evidence of malignancy. A result letter of this
screening mammogram will be mailed directly to the patient.

RECOMMENDATION:
Screening mammogram in one year. (Code:[id number])

BI-RADS CATEGORY  1: Negative.
